# Patient Record
Sex: Female | Born: 1935 | Race: White | Hispanic: No | Smoking: Never smoker
Health system: Southern US, Community
[De-identification: ages and names within clinical notes are randomized; demographics above are authoritative.]

## PROBLEM LIST (undated history)

## (undated) DIAGNOSIS — H409 Unspecified glaucoma: Secondary | ICD-10-CM

## (undated) HISTORY — PX: ABDOMINAL HYSTERECTOMY: SHX81

## (undated) HISTORY — PX: BACK SURGERY: SHX140

---

## 1999-06-02 ENCOUNTER — Other Ambulatory Visit: Admission: RE | Admit: 1999-06-02 | Discharge: 1999-06-02 | Payer: Self-pay | Admitting: Internal Medicine

## 1999-08-02 ENCOUNTER — Encounter: Admission: RE | Admit: 1999-08-02 | Discharge: 1999-08-02 | Payer: Self-pay | Admitting: Internal Medicine

## 1999-08-02 ENCOUNTER — Encounter: Payer: Self-pay | Admitting: Internal Medicine

## 1999-08-25 ENCOUNTER — Ambulatory Visit (HOSPITAL_COMMUNITY): Admission: RE | Admit: 1999-08-25 | Discharge: 1999-08-25 | Payer: Self-pay | Admitting: Gastroenterology

## 2000-06-09 ENCOUNTER — Other Ambulatory Visit: Admission: RE | Admit: 2000-06-09 | Discharge: 2000-06-09 | Payer: Self-pay | Admitting: Internal Medicine

## 2000-08-03 ENCOUNTER — Encounter: Payer: Self-pay | Admitting: Internal Medicine

## 2000-08-03 ENCOUNTER — Encounter: Admission: RE | Admit: 2000-08-03 | Discharge: 2000-08-03 | Payer: Self-pay | Admitting: Internal Medicine

## 2001-05-07 ENCOUNTER — Encounter: Payer: Self-pay | Admitting: Internal Medicine

## 2001-05-07 ENCOUNTER — Encounter: Admission: RE | Admit: 2001-05-07 | Discharge: 2001-05-07 | Payer: Self-pay | Admitting: Internal Medicine

## 2001-05-10 ENCOUNTER — Encounter: Payer: Self-pay | Admitting: Internal Medicine

## 2001-05-10 ENCOUNTER — Encounter: Admission: RE | Admit: 2001-05-10 | Discharge: 2001-05-10 | Payer: Self-pay | Admitting: Internal Medicine

## 2001-08-06 ENCOUNTER — Encounter: Payer: Self-pay | Admitting: Internal Medicine

## 2001-08-06 ENCOUNTER — Encounter: Admission: RE | Admit: 2001-08-06 | Discharge: 2001-08-06 | Payer: Self-pay | Admitting: Internal Medicine

## 2002-08-08 ENCOUNTER — Encounter: Admission: RE | Admit: 2002-08-08 | Discharge: 2002-08-08 | Payer: Self-pay | Admitting: Internal Medicine

## 2002-08-08 ENCOUNTER — Encounter: Payer: Self-pay | Admitting: Internal Medicine

## 2002-08-20 ENCOUNTER — Other Ambulatory Visit: Admission: RE | Admit: 2002-08-20 | Discharge: 2002-08-20 | Payer: Self-pay | Admitting: Internal Medicine

## 2003-06-23 ENCOUNTER — Encounter (INDEPENDENT_AMBULATORY_CARE_PROVIDER_SITE_OTHER): Payer: Self-pay | Admitting: Specialist

## 2003-06-23 ENCOUNTER — Ambulatory Visit (HOSPITAL_COMMUNITY): Admission: RE | Admit: 2003-06-23 | Discharge: 2003-06-23 | Payer: Self-pay | Admitting: *Deleted

## 2003-08-18 ENCOUNTER — Encounter: Admission: RE | Admit: 2003-08-18 | Discharge: 2003-08-18 | Payer: Self-pay | Admitting: Internal Medicine

## 2005-10-25 ENCOUNTER — Other Ambulatory Visit: Admission: RE | Admit: 2005-10-25 | Discharge: 2005-10-25 | Payer: Self-pay | Admitting: Internal Medicine

## 2006-09-19 HISTORY — PX: RECTAL PROLAPSE REPAIR: SHX759

## 2006-11-23 ENCOUNTER — Encounter: Admission: RE | Admit: 2006-11-23 | Discharge: 2006-11-23 | Payer: Self-pay | Admitting: Internal Medicine

## 2007-11-27 ENCOUNTER — Encounter: Admission: RE | Admit: 2007-11-27 | Discharge: 2007-11-27 | Payer: Self-pay | Admitting: Internal Medicine

## 2008-12-12 ENCOUNTER — Encounter: Admission: RE | Admit: 2008-12-12 | Discharge: 2008-12-12 | Payer: Self-pay | Admitting: Internal Medicine

## 2009-05-06 ENCOUNTER — Other Ambulatory Visit: Admission: RE | Admit: 2009-05-06 | Discharge: 2009-05-06 | Payer: Self-pay | Admitting: Obstetrics and Gynecology

## 2009-11-16 ENCOUNTER — Inpatient Hospital Stay (HOSPITAL_COMMUNITY): Admission: RE | Admit: 2009-11-16 | Discharge: 2009-11-18 | Payer: Self-pay | Admitting: Obstetrics and Gynecology

## 2009-11-16 ENCOUNTER — Encounter (INDEPENDENT_AMBULATORY_CARE_PROVIDER_SITE_OTHER): Payer: Self-pay | Admitting: Obstetrics and Gynecology

## 2009-11-25 ENCOUNTER — Inpatient Hospital Stay (HOSPITAL_COMMUNITY): Admission: EM | Admit: 2009-11-25 | Discharge: 2009-11-27 | Payer: Self-pay | Admitting: Emergency Medicine

## 2009-11-26 ENCOUNTER — Encounter (INDEPENDENT_AMBULATORY_CARE_PROVIDER_SITE_OTHER): Payer: Self-pay | Admitting: Internal Medicine

## 2009-11-26 ENCOUNTER — Ambulatory Visit: Payer: Self-pay | Admitting: Vascular Surgery

## 2010-05-10 ENCOUNTER — Encounter: Admission: RE | Admit: 2010-05-10 | Discharge: 2010-05-10 | Payer: Self-pay | Admitting: Internal Medicine

## 2010-12-08 LAB — URINE MICROSCOPIC-ADD ON

## 2010-12-08 LAB — TYPE AND SCREEN
ABO/RH(D): A POS
ABO/RH(D): A POS

## 2010-12-08 LAB — CBC
HCT: 24.3 % — ABNORMAL LOW (ref 36.0–46.0)
Hemoglobin: 8.3 g/dL — ABNORMAL LOW (ref 12.0–15.0)
MCHC: 33.3 g/dL (ref 30.0–36.0)
MCHC: 34.1 g/dL (ref 30.0–36.0)
MCV: 89 fL (ref 78.0–100.0)
MCV: 89.3 fL (ref 78.0–100.0)
Platelets: 120 10*3/uL — ABNORMAL LOW (ref 150–400)
Platelets: 170 10*3/uL (ref 150–400)
RBC: 4.05 MIL/uL (ref 3.87–5.11)
RDW: 13.1 % (ref 11.5–15.5)
RDW: 13.3 % (ref 11.5–15.5)

## 2010-12-08 LAB — URINALYSIS, ROUTINE W REFLEX MICROSCOPIC
Glucose, UA: NEGATIVE mg/dL
Hgb urine dipstick: NEGATIVE
Nitrite: NEGATIVE

## 2010-12-08 LAB — BASIC METABOLIC PANEL
CO2: 30 mEq/L (ref 19–32)
GFR calc non Af Amer: >60 mL/min (ref >60)
Glucose, Bld: 83 mg/dL (ref 70–99)
Sodium: 138 mEq/L (ref 135–145)

## 2010-12-12 LAB — HEMOGLOBIN: Hemoglobin: 7.9 g/dL — ABNORMAL LOW (ref 12.0–15.0)

## 2010-12-12 LAB — CBC
HCT: 22.1 % — ABNORMAL LOW (ref 36.0–46.0)
MCV: 90.4 fL (ref 78.0–100.0)
Platelets: 111 10*3/uL — ABNORMAL LOW (ref 150–400)
WBC: 9 10*3/uL (ref 4.0–10.5)

## 2010-12-13 LAB — CBC
HCT: 26.7 % — ABNORMAL LOW (ref 36.0–46.0)
Hemoglobin: 8.7 g/dL — ABNORMAL LOW (ref 12.0–15.0)
Hemoglobin: 9 g/dL — ABNORMAL LOW (ref 12.0–15.0)
MCV: 89.7 fL (ref 78.0–100.0)
Platelets: 224 10*3/uL (ref 150–400)
RBC: 2.87 MIL/uL — ABNORMAL LOW (ref 3.87–5.11)
RBC: 2.99 MIL/uL — ABNORMAL LOW (ref 3.87–5.11)
WBC: 6.2 10*3/uL (ref 4.0–10.5)
WBC: 8.5 10*3/uL (ref 4.0–10.5)

## 2010-12-13 LAB — POCT CARDIAC MARKERS: Myoglobin, poc: 77.1 ng/mL (ref 12–200)

## 2010-12-13 LAB — COMPREHENSIVE METABOLIC PANEL
ALT: 11 U/L (ref 0–35)
AST: 15 U/L (ref 0–37)
CO2: 27 mEq/L (ref 19–32)
Chloride: 105 mEq/L (ref 96–112)
GFR calc Af Amer: >60 mL/min (ref >60)
GFR calc non Af Amer: >60 mL/min (ref >60)
Glucose, Bld: 84 mg/dL (ref 70–99)
Sodium: 140 mEq/L (ref 135–145)
Total Bilirubin: 0.4 mg/dL (ref 0.3–1.2)

## 2010-12-13 LAB — FERRITIN: Ferritin: 39 ng/mL (ref 10–291)

## 2010-12-13 LAB — DIFFERENTIAL
Basophils Absolute: 0 10*3/uL (ref 0.0–0.1)
Eosinophils Relative: 2 % (ref 0–5)
Lymphs Abs: 1 10*3/uL (ref 0.7–4.0)
Monocytes Absolute: 0.7 10*3/uL (ref 0.1–1.0)
Monocytes Relative: 8 % (ref 3–12)

## 2010-12-13 LAB — CK TOTAL AND CKMB (NOT AT ARMC): CK, MB: 1 ng/mL (ref 0.3–4.0)

## 2010-12-13 LAB — D-DIMER, QUANTITATIVE: D-Dimer, Quant: 3.56 ug/mL-FEU — ABNORMAL HIGH (ref 0.00–0.48)

## 2010-12-13 LAB — TROPONIN I: Troponin I: <0.01 ng/mL (ref 0.00–0.06)

## 2010-12-13 LAB — POCT I-STAT, CHEM 8
BUN: 15 mg/dL (ref 6–23)
Chloride: 104 mEq/L (ref 96–112)
Glucose, Bld: 88 mg/dL (ref 70–99)

## 2010-12-13 LAB — IRON AND TIBC
Saturation Ratios: 9 % — ABNORMAL LOW (ref 20–55)
TIBC: 340 ug/dL (ref 250–470)

## 2010-12-13 LAB — CARDIAC PANEL(CRET KIN+CKTOT+MB+TROPI)
Relative Index: INVALID (ref 0.0–2.5)
Total CK: 49 U/L (ref 7–177)

## 2010-12-13 LAB — FOLATE: Folate: >20 ng/mL

## 2011-02-03 ENCOUNTER — Other Ambulatory Visit: Payer: Self-pay | Admitting: Gastroenterology

## 2011-02-03 DIAGNOSIS — Z1211 Encounter for screening for malignant neoplasm of colon: Secondary | ICD-10-CM

## 2011-02-04 NOTE — Op Note (Signed)
   NAME:  Sara Humphrey, Sara Humphrey                        ACCOUNT NO.:  [id number]   MEDICAL RECORD NO.:  [id number]                   PATIENT TYPE:  AMB   LOCATION:  SDC                                  FACILITY:  [facility]   PHYSICIAN:  [place] B. Earlene Plater, M.D.               DATE OF BIRTH:  1931-04-26   DATE OF PROCEDURE:  06/23/2003  DATE OF DISCHARGE:                                 OPERATIVE REPORT   PREOPERATIVE DIAGNOSES:  1. Postmenopausal bleeding.  2. Endometrial mass on saline infusion ultrasound.   POSTOPERATIVE DIAGNOSES:  1. Postmenopausal bleeding.  2. Endometrial mass on saline infusion ultrasound.   PROCEDURE:  Hysteroscopy and resection of anterior submucosal fibroid.   ANESTHESIA:  General and 20 mL 2% Nesacaine paracervical block.   FINDINGS:  Thin endometrium.  Normal-appearing tubal ostia.  Anterior  submucosal fibroid approximately 2-3 cm in diameter.   ESTIMATED BLOOD LOSS:  Less than 50 mL.   FLUID DEFICIT:  40 mL of sorbitol.   COMPLICATIONS:  None.   SPECIMENS:  Portions of submucosal fibroid.   INDICATIONS:  Patient with a history of postmenopausal bleeding.  Pelvic  ultrasound and subsequent saline infusion ultrasound were suggestive of an  endometrial mass.   DESCRIPTION OF PROCEDURE:  The patient was taken to the operating room and  general anesthesia obtained.  She was placed in the [place] stirrups and  prepped and draped in the standard fashion.  Bladder emptied with a red  rubber catheter.  Exam under anesthesia showed a normal size anterior  uterus, no adnexal masses palpable.   Speculum inserted, paracervical block placed.  A single-tooth tenaculum  placed on the anterior lip of the cervix.  The cervix dilated to a #21 and  the diagnostic hysteroscope inserted after being flushed with sorbitol.  An  apparent submucosal fibroid noted in the anterior lower uterine segment.   The cervix was then dilated up to a #31 and the resectoscope inserted after  being flushed with sorbitol.  The double loop resection tool was used to  remove the fibroid flush with the endometrial cavity.  Hemostasis attained.  No other abnormalities noted; therefore, the procedure was terminated.   Instruments were removed and the cervix was hemostatic.  The patient was  taken to the recovery room awake, alert, in stable condition.                                               Gerri Spore B. Earlene Plater, M.D.    WBD/MEDQ  D:  06/23/2003  T:  06/23/2003  Job:  [id number]

## 2011-02-04 NOTE — H&P (Signed)
   NAME:  Sara Humphrey, Sara Humphrey                        ACCOUNT NO.:  [id number]   MEDICAL RECORD NO.:  [id number]                   PATIENT TYPE:  AMB   LOCATION:  SDC                                  [facility]ILITY:  [facility]   PHYSICIAN:   B. Earlene Plater, M.D.               DATE OF BIRTH:  May 01, 1931   DATE OF ADMISSION:  DATE OF DISCHARGE:                                HISTORY & PHYSICAL   PREOPERATIVE DIAGNOSES:  1. Postmenopausal bleeding.  2. Suspected endometrial polyp.   HISTORY OF PRESENT ILLNESS:  A 77 year old white female, gravida 4, para 4,  initially seen at the request of Dr. Earl Gala for evaluation of  postmenopausal bleeding.  This has been episodic and mostly spotting.  No  aggravating or alleviating factors.  No hormone therapy in the last 10  years.  Pap smear normal in November of 2003.  Ultrasound in the office  showed a focal endometrial thickening and subsequent saline infusion  ultrasound suggestive of endometrial polyp.   PAST MEDICAL HISTORY:  Osteoporosis.   PAST SURGICAL HISTORY:  1. Benign breast lump removal.  2. Back surgery.   MEDICATIONS:  Fosamax, aspirin, and vitamins.   ALLERGIES:  None.   SOCIAL HISTORY:  No alcohol, tobacco, or drugs.   FAMILY HISTORY:  Noncontributory.   REVIEW OF SYSTEMS:  Otherwise negative.   PHYSICAL EXAMINATION:  VITAL SIGNS:  Blood pressure 130/86, pulse 72.  WEIGHT:  130 pounds.  GENERAL APPEARANCE:  Alert and oriented.  In no acute distress.  SKIN:  Warm and dry with no lesions.  HEART:  Regular rate and rhythm.  LUNGS:  Clear to auscultation.  ABDOMEN:  The liver and spleen are normal.  No hernia noted.  PELVIC:  Normal external genitalia.  The vagina and cervix are normal.  The  uterus is normal size, anteverted, and nontender.  No adnexal masses  palpable.  Pelvic ultrasound showed endometrial thickening.  Subsequent  sonohysterogram showed a 1.5 cm, hyperechoic mass in the endometrial cavity.   ASSESSMENT:   Postmenopausal bleeding.  Ultrasound findings suggestive of  endometrial polyp.   PLAN:  1. Hysteroscopy.  2. D&C.  3. Polyp removal.   Operative risks discussed, including infection, bleeding, uterine  perforation, damage to surrounding organs, and fluid overload.  All  questions answered.  The patient wished to proceed.                                               Gerri Spore B. Earlene Plater, M.D.    WBD/MEDQ  D:  06/16/2003  T:  06/16/2003  Job:  [id number]   cc:   Theressa Millard, M.D.  [address] [place]  [place] [address]  Fax: [phone]

## 2011-02-11 ENCOUNTER — Ambulatory Visit
Admission: RE | Admit: 2011-02-11 | Discharge: 2011-02-11 | Disposition: A | Discharge status: Home / Self Care | Payer: Medicare Other | Source: Ambulatory Visit | Attending: Gastroenterology | Admitting: Gastroenterology

## 2011-02-11 DIAGNOSIS — Z1211 Encounter for screening for malignant neoplasm of colon: Secondary | ICD-10-CM

## 2011-10-25 ENCOUNTER — Other Ambulatory Visit: Payer: Self-pay | Admitting: Internal Medicine

## 2011-10-25 DIAGNOSIS — Z1231 Encounter for screening mammogram for malignant neoplasm of breast: Secondary | ICD-10-CM

## 2011-11-23 ENCOUNTER — Ambulatory Visit
Admission: RE | Admit: 2011-11-23 | Discharge: 2011-11-23 | Disposition: A | Discharge status: Home / Self Care | Payer: Medicare Other | Source: Ambulatory Visit | Attending: Internal Medicine | Admitting: Internal Medicine

## 2011-11-23 DIAGNOSIS — Z1231 Encounter for screening mammogram for malignant neoplasm of breast: Secondary | ICD-10-CM

## 2013-05-21 ENCOUNTER — Other Ambulatory Visit: Payer: Self-pay

## 2013-05-21 DIAGNOSIS — Z1231 Encounter for screening mammogram for malignant neoplasm of breast: Secondary | ICD-10-CM

## 2013-06-11 ENCOUNTER — Ambulatory Visit
Admission: RE | Admit: 2013-06-11 | Discharge: 2013-06-11 | Disposition: A | Discharge status: Home / Self Care | Payer: Medicare PPO | Source: Ambulatory Visit

## 2013-06-11 DIAGNOSIS — Z1231 Encounter for screening mammogram for malignant neoplasm of breast: Secondary | ICD-10-CM

## 2013-07-09 ENCOUNTER — Ambulatory Visit
Admission: RE | Admit: 2013-07-09 | Discharge: 2013-07-09 | Disposition: A | Discharge status: Home / Self Care | Payer: Medicare PPO | Source: Ambulatory Visit | Attending: Internal Medicine | Admitting: Internal Medicine

## 2013-07-09 ENCOUNTER — Other Ambulatory Visit: Payer: Self-pay | Admitting: Internal Medicine

## 2013-07-09 DIAGNOSIS — R52 Pain, unspecified: Secondary | ICD-10-CM

## 2013-07-11 ENCOUNTER — Other Ambulatory Visit: Payer: Self-pay | Admitting: Internal Medicine

## 2013-07-11 DIAGNOSIS — R109 Unspecified abdominal pain: Secondary | ICD-10-CM

## 2013-07-16 ENCOUNTER — Ambulatory Visit
Admission: RE | Admit: 2013-07-16 | Discharge: 2013-07-16 | Disposition: A | Discharge status: Home / Self Care | Payer: Medicare PPO | Source: Ambulatory Visit | Attending: Internal Medicine | Admitting: Internal Medicine

## 2013-07-16 DIAGNOSIS — R109 Unspecified abdominal pain: Secondary | ICD-10-CM

## 2013-07-16 MED ORDER — IOHEXOL 300 MG/ML  SOLN
100.0000 mL | Freq: Once | INTRAMUSCULAR | Status: AC | PRN
  Administered 2013-07-16: 100 mL via INTRAVENOUS

## 2014-07-31 ENCOUNTER — Other Ambulatory Visit: Payer: Self-pay

## 2014-07-31 DIAGNOSIS — Z1231 Encounter for screening mammogram for malignant neoplasm of breast: Secondary | ICD-10-CM

## 2014-08-25 ENCOUNTER — Ambulatory Visit
Admission: RE | Admit: 2014-08-25 | Discharge: 2014-08-25 | Disposition: A | Discharge status: Home / Self Care | Payer: Medicare PPO | Source: Ambulatory Visit

## 2014-08-25 DIAGNOSIS — Z1231 Encounter for screening mammogram for malignant neoplasm of breast: Secondary | ICD-10-CM

## 2014-11-06 DIAGNOSIS — H4011X1 Primary open-angle glaucoma, mild stage: Secondary | ICD-10-CM | POA: Diagnosis not present

## 2015-04-06 DIAGNOSIS — H4011X2 Primary open-angle glaucoma, moderate stage: Secondary | ICD-10-CM | POA: Diagnosis not present

## 2015-04-15 DIAGNOSIS — M25572 Pain in left ankle and joints of left foot: Secondary | ICD-10-CM | POA: Diagnosis not present

## 2015-05-08 DIAGNOSIS — M25572 Pain in left ankle and joints of left foot: Secondary | ICD-10-CM | POA: Diagnosis not present

## 2015-05-29 DIAGNOSIS — M25572 Pain in left ankle and joints of left foot: Secondary | ICD-10-CM | POA: Diagnosis not present

## 2017-08-11 ENCOUNTER — Other Ambulatory Visit: Payer: Self-pay

## 2017-08-11 ENCOUNTER — Inpatient Hospital Stay (HOSPITAL_COMMUNITY)
Admission: EM | Admit: 2017-08-11 | Discharge: 2017-08-15 | DRG: 392 | Disposition: A | Discharge status: Home / Self Care | Payer: Medicare Other | Attending: Internal Medicine | Admitting: Internal Medicine

## 2017-08-11 ENCOUNTER — Emergency Department (HOSPITAL_COMMUNITY): Payer: Medicare Other

## 2017-08-11 ENCOUNTER — Encounter (HOSPITAL_COMMUNITY): Payer: Self-pay | Admitting: Emergency Medicine

## 2017-08-11 DIAGNOSIS — Z90710 Acquired absence of both cervix and uterus: Secondary | ICD-10-CM | POA: Diagnosis not present

## 2017-08-11 DIAGNOSIS — H409 Unspecified glaucoma: Secondary | ICD-10-CM | POA: Diagnosis present

## 2017-08-11 DIAGNOSIS — D696 Thrombocytopenia, unspecified: Secondary | ICD-10-CM | POA: Diagnosis present

## 2017-08-11 DIAGNOSIS — K5792 Diverticulitis of intestine, part unspecified, without perforation or abscess without bleeding: Secondary | ICD-10-CM | POA: Diagnosis present

## 2017-08-11 DIAGNOSIS — E162 Hypoglycemia, unspecified: Secondary | ICD-10-CM | POA: Diagnosis present

## 2017-08-11 DIAGNOSIS — H4010X0 Unspecified open-angle glaucoma, stage unspecified: Secondary | ICD-10-CM | POA: Diagnosis not present

## 2017-08-11 DIAGNOSIS — J90 Pleural effusion, not elsewhere classified: Secondary | ICD-10-CM | POA: Diagnosis present

## 2017-08-11 DIAGNOSIS — N179 Acute kidney failure, unspecified: Secondary | ICD-10-CM | POA: Diagnosis present

## 2017-08-11 DIAGNOSIS — E860 Dehydration: Secondary | ICD-10-CM | POA: Diagnosis present

## 2017-08-11 DIAGNOSIS — Z7982 Long term (current) use of aspirin: Secondary | ICD-10-CM

## 2017-08-11 DIAGNOSIS — Z981 Arthrodesis status: Secondary | ICD-10-CM | POA: Diagnosis not present

## 2017-08-11 DIAGNOSIS — K5700 Diverticulitis of small intestine with perforation and abscess without bleeding: Secondary | ICD-10-CM | POA: Diagnosis present

## 2017-08-11 DIAGNOSIS — Z79899 Other long term (current) drug therapy: Secondary | ICD-10-CM

## 2017-08-11 HISTORY — DX: Unspecified glaucoma: H40.9

## 2017-08-11 LAB — URINALYSIS, ROUTINE W REFLEX MICROSCOPIC
BILIRUBIN URINE: NEGATIVE
GLUCOSE, UA: NEGATIVE mg/dL
Ketones, ur: 5 mg/dL — AB
NITRITE: NEGATIVE
Protein, ur: NEGATIVE mg/dL
SPECIFIC GRAVITY, URINE: 1.01 (ref 1.005–1.030)
pH: 5 (ref 5.0–8.0)

## 2017-08-11 LAB — CBC
HEMATOCRIT: 36.4 % (ref 36.0–46.0)
HEMOGLOBIN: 12 g/dL (ref 12.0–15.0)
MCH: 30.3 pg (ref 26.0–34.0)
MCHC: 33 g/dL (ref 30.0–36.0)
MCV: 91.9 fL (ref 78.0–100.0)
Platelets: 132 10*3/uL — ABNORMAL LOW (ref 150–400)
RBC: 3.96 MIL/uL (ref 3.87–5.11)
RDW: 13.7 % (ref 11.5–15.5)
WBC: 12.1 10*3/uL — ABNORMAL HIGH (ref 4.0–10.5)

## 2017-08-11 LAB — COMPREHENSIVE METABOLIC PANEL
ALBUMIN: 3.7 g/dL (ref 3.5–5.0)
ALT: 14 U/L (ref 14–54)
ANION GAP: 8 (ref 5–15)
AST: 20 U/L (ref 15–41)
Alkaline Phosphatase: 60 U/L (ref 38–126)
BILIRUBIN TOTAL: 1.4 mg/dL — AB (ref 0.3–1.2)
BUN: 28 mg/dL — AB (ref 6–20)
CHLORIDE: 99 mmol/L — AB (ref 101–111)
CO2: 27 mmol/L (ref 22–32)
Calcium: 8.8 mg/dL — ABNORMAL LOW (ref 8.9–10.3)
Creatinine, Ser: 1.15 mg/dL — ABNORMAL HIGH (ref 0.44–1.00)
GFR calc Af Amer: 49 mL/min — ABNORMAL LOW (ref >60)
GFR calc non Af Amer: 42 mL/min — ABNORMAL LOW (ref >60)
GLUCOSE: 95 mg/dL (ref 65–99)
POTASSIUM: 4.1 mmol/L (ref 3.5–5.1)
SODIUM: 134 mmol/L — AB (ref 135–145)
TOTAL PROTEIN: 7.8 g/dL (ref 6.5–8.1)

## 2017-08-11 LAB — LIPASE, BLOOD: Lipase: 23 U/L (ref 11–51)

## 2017-08-11 MED ORDER — SODIUM CHLORIDE 0.9 % IV BOLUS (SEPSIS)
500.0000 mL | Freq: Once | INTRAVENOUS | Status: AC
  Administered 2017-08-11: 500 mL via INTRAVENOUS

## 2017-08-11 MED ORDER — LATANOPROST 0.005 % OP SOLN
1.0000 [drp] | Freq: Every day | OPHTHALMIC | Status: DC
  Administered 2017-08-11 – 2017-08-14 (×4): 1 [drp] via OPHTHALMIC
  Filled 2017-08-11: qty 2.5

## 2017-08-11 MED ORDER — SODIUM CHLORIDE 0.9 % IV SOLN
INTRAVENOUS | Status: DC
  Administered 2017-08-11 – 2017-08-12 (×2): via INTRAVENOUS

## 2017-08-11 MED ORDER — PIPERACILLIN-TAZOBACTAM 3.375 G IVPB
3.3750 g | Freq: Three times a day (TID) | INTRAVENOUS | Status: DC
  Administered 2017-08-12 – 2017-08-15 (×10): 3.375 g via INTRAVENOUS
  Filled 2017-08-11 (×12): qty 50

## 2017-08-11 MED ORDER — PIPERACILLIN-TAZOBACTAM 3.375 G IVPB 30 MIN
3.3750 g | Freq: Once | INTRAVENOUS | Status: AC
  Administered 2017-08-11: 3.375 g via INTRAVENOUS
  Filled 2017-08-11: qty 50

## 2017-08-11 MED ORDER — IOPAMIDOL (ISOVUE-300) INJECTION 61%
75.0000 mL | Freq: Once | INTRAVENOUS | Status: AC | PRN
  Administered 2017-08-11: 75 mL via INTRAVENOUS

## 2017-08-11 MED ORDER — IOPAMIDOL (ISOVUE-300) INJECTION 61%: 100.0000 mL | Freq: Once | INTRAVENOUS | Status: DC | PRN

## 2017-08-11 MED ORDER — ENOXAPARIN SODIUM 30 MG/0.3ML ~~LOC~~ SOLN
30.0000 mg | SUBCUTANEOUS | Status: DC
  Administered 2017-08-11: 30 mg via SUBCUTANEOUS
  Filled 2017-08-11: qty 0.3

## 2017-08-11 MED ORDER — IOPAMIDOL (ISOVUE-300) INJECTION 61%
INTRAVENOUS | Status: AC
  Filled 2017-08-11: qty 75

## 2017-08-11 NOTE — ED Triage Notes (Signed)
[facility],  reports abd pressure and tenderness x3 days. Pt unable to eat or drink x3 days. Pt reports normal BM, last one this am. [facility] tests confirms elevated WBC of 14.4 and urine + for leuk and moderate blood. Pt denies dysuria or frequency. Pt is A&Ox 4 and in NAD.

## 2017-08-11 NOTE — H&P (Signed)
History and Physical    Sara PenningBetty J Jividen NWG:[id number]:[id number] DOB: October 28, 1930 DOA: 08/11/2017  PCP: Lorenda IshiharaVaradarajan, Rupashree, MD 3 Patient coming from: home   Chief Complaint: abd pain  HPI: Sara Humphrey is a 83 y.o. female with medical history significant of glaucoma presents with three days steadily worsening abdominal pain that is generalized but worse in the left lower quadrant. Also associated anorexia. No fever, no nausea/vomiting. Pain is not severe but is worse when she presses on her abdomen. Is passing flatus and stool, no diarrhea, no blood in stool. No dysuria or urinary frequency. No palpitations or chest pain. Has not happened before  ED Course: fluids, labs, imaging, gen surgery consult, zosyn  Review of Systems: As per HPI otherwise 10 point review of systems negative.    Past Medical History:  Diagnosis Date  . Glaucoma     Past Surgical History:  Procedure Laterality Date  . ABDOMINAL HYSTERECTOMY    . BACK SURGERY     fusion  . RECTAL PROLAPSE REPAIR N/A 2008   rectal prolapsed into vagina     reports that  has never smoked. she has never used smokeless tobacco. She reports that she does not drink alcohol or use drugs.  No Known Allergies  No family history on file.   Prior to Admission medications   Medication Sig Start Date End Date Taking? Authorizing Provider  Ascorbic Acid (VITAMIN C) 100 MG tablet Take 100 mg by mouth daily.   Yes [provider]  aspirin EC 81 MG tablet Take 81 mg by mouth daily.   Yes [provider]  cholecalciferol (VITAMIN D) 1000 units tablet Take 1,000 Units by mouth daily.   Yes [provider]  LUMIGAN 0.01 % SOLN PUT 1 DROP IN BOTH EYES AT BEDTIME 07/28/17  Yes [provider]  VITAMIN E PO Take 1 tablet by mouth daily.   Yes [provider]    Physical Exam: Vitals:   08/11/17 1443 08/11/17 1450 08/11/17 1514 08/11/17 1813  BP: 138/69   (!) 123/53  Pulse: 92   72  Resp: 16    15  Temp: 97.7 F (36.5 C)     TempSrc: Oral     SpO2: 97%  100% 99%  Weight:  56.7 kg (125 lb)    Height:  5' (1.524 m)      Constitutional: NAD, calm, comfortable Vitals:   08/11/17 1443 08/11/17 1450 08/11/17 1514 08/11/17 1813  BP: 138/69   (!) 123/53  Pulse: 92   72  Resp: 16   15  Temp: 97.7 F (36.5 C)     TempSrc: Oral     SpO2: 97%  100% 99%  Weight:  56.7 kg (125 lb)    Height:  5' (1.524 m)     Eyes: PERRL, lids and conjunctivae normal ENMT: Mucous membranes are moist. Neck: normal, supple,  Respiratory: clear to auscultation bilaterally, no wheezing, no crackles. Normal respiratory effort. No accessory muscle use.  Cardiovascular: Regular rate and rhythm, no murmurs / rubs / gallops. No extremity edema. 2+ pedal pulses. No carotid bruits.  Abdomen: diffuse mild ttp worse in llq, no rebound, slight guarding. Not distended.  Musculoskeletal: no clubbing / cyanosis.  Skin: no rashes, lesions, ulcers. No induration Neurologic: moving all 4 extremities Psychiatric: Normal judgment and insight.  Normal mood.     Labs on Admission: I have personally reviewed following labs and imaging studies  CBC: Recent Labs  Lab 08/11/17 1502  WBC  12.1*  HGB 12.0  HCT 36.4  MCV 91.9  PLT 132*   Basic Metabolic Panel: Recent Labs  Lab 08/11/17 1502  NA 134*  K 4.1  CL 99*  CO2 27  GLUCOSE 95  BUN 28*  CREATININE 1.15*  CALCIUM 8.8*   GFR: Estimated Creatinine Clearance: 28.7 mL/min (A) (by C-G formula based on SCr of 1.15 mg/dL (H)). Liver Function Tests: Recent Labs  Lab 08/11/17 1502  AST 20  ALT 14  ALKPHOS 60  BILITOT 1.4*  PROT 7.8  ALBUMIN 3.7   Recent Labs  Lab 08/11/17 1502  LIPASE 23   No results for input(s): AMMONIA in the last 168 hours. Coagulation Profile: No results for input(s): INR, PROTIME in the last 168 hours. Cardiac Enzymes: No results for input(s): CKTOTAL, CKMB, CKMBINDEX, TROPONINI in the last 168 hours. BNP (last 3  results) No results for input(s): PROBNP in the last 8760 hours. HbA1C: No results for input(s): HGBA1C in the last 72 hours. CBG: No results for input(s): GLUCAP in the last 168 hours. Lipid Profile: No results for input(s): CHOL, HDL, LDLCALC, TRIG, CHOLHDL, LDLDIRECT in the last 72 hours. Thyroid Function Tests: No results for input(s): TSH, T4TOTAL, FREET4, T3FREE, THYROIDAB in the last 72 hours. Anemia Panel: No results for input(s): VITAMINB12, FOLATE, FERRITIN, TIBC, IRON, RETICCTPCT in the last 72 hours. Urine analysis:    Component Value Date/Time   COLORURINE YELLOW 08/11/2017 1601   APPEARANCEUR CLEAR 08/11/2017 1601   LABSPEC 1.010 08/11/2017 1601   PHURINE 5.0 08/11/2017 1601   GLUCOSEU NEGATIVE 08/11/2017 1601   HGBUR MODERATE (A) 08/11/2017 1601   BILIRUBINUR NEGATIVE 08/11/2017 1601   KETONESUR 5 (A) 08/11/2017 1601   PROTEINUR NEGATIVE 08/11/2017 1601   UROBILINOGEN 0.2 11/13/2009 0941   NITRITE NEGATIVE 08/11/2017 1601   LEUKOCYTESUR MODERATE (A) 08/11/2017 1601    Radiological Exams on Admission: Ct Abdomen Pelvis W Contrast  Result Date: 08/11/2017 CLINICAL DATA:  Acute generalized abdominal pain. EXAM: CT ABDOMEN AND PELVIS WITH CONTRAST TECHNIQUE: Multidetector CT imaging of the abdomen and pelvis was performed using the standard protocol following bolus administration of intravenous contrast. CONTRAST:  75mL ISOVUE-300 IOPAMIDOL (ISOVUE-300) INJECTION 61% COMPARISON:  CT scan of July 16, 2013. FINDINGS: Lower chest: No acute abnormality. Hepatobiliary: No gallstones are noted. Stable hepatic cysts are noted. Pancreas: Unremarkable. No pancreatic ductal dilatation or surrounding inflammatory changes. Spleen: Normal in size without focal abnormality. Adrenals/Urinary Tract: Adrenal glands are unremarkable. Kidneys are normal, without renal calculi, focal lesion, or hydronephrosis. Bladder is unremarkable. Stomach/Bowel: Sigmoid diverticulosis is noted without  inflammation. The stomach is unremarkable. The appendix is not definitively visualized. There is severe focal inflammation involving loops of small bowel in the left lower quadrant, most consistent jejunal diverticulitis. 2.9 x 2.3 cm fluid collection is seen in this area concerning for possible abscess or contained perforation. Vascular/Lymphatic: Aortic atherosclerosis. No enlarged abdominal or pelvic lymph nodes. Reproductive: Status post hysterectomy. No adnexal masses. Other: No abdominal wall hernia or abnormality. No abdominopelvic ascites. Musculoskeletal: No acute or significant osseous findings. IMPRESSION: Severe focal inflammation is seen involving several loops of small bowel in the left lower quadrant, concerning for jejunal diverticulitis. Adjacent fluid collection measuring 2.9 x 2.3 cm is noted concerning for possible abscess or contained perforation. Electronically Signed   By: Lupita RaiderJames  Green Jr, M.D.   On: 08/11/2017 18:15     Assessment/Plan Active Problems:   Acute diverticulitis   Glaucoma  # Acute diverticulitis - history and exam consistent,  CT showing jejunal diverticulitis with possible abscess or small perforation. Hemodynamically stable, no significant pain, no sig lab anormalities. Gen surg consulted, will see tonight or in AM. Initial plan is medical mgmt - NPO, type and screen - IV fluids NS @ 125/hr - zosyn per pharmacy  # AKI - likely 2/2 dehydration given reduced by mouth. Cr 1.15 from baseline presumed to be normal - fluids as above, am metabolic profile  # thrombocytopenia - mild. Denies hx of. Does take daily aspirin (held). This could be lab error, could be 2/2 infection - repeat cbc in AM, also checking HIV and hep c  # Glaucoma - substitute latanoprost for non-formulary home med. Sons are going to check and verify what meds she takes for glaucoma and will inform nursing  DVT prophylaxis: heparin, scds Code Status: full Family Communication: son les  Saner [phone] Disposition Plan: home Consults called: gen surg Dr. Gerrit Friends Admission status: med/surg   Silvano Bilis MD [facility] Pager [phone]  If 7PM-7AM, please contact night-coverage [email or web address] Password [facility]  08/11/2017, 8:07 PM

## 2017-08-11 NOTE — ED Notes (Signed)
ED TO INPATIENT HANDOFF REPORT  Name/Age/Gender Sara Humphrey 83 y.o. female  Code Status Code Status History    This patient does not have a recorded code status. Please follow your organizational policy for patients in this situation.      Home/SNF/Other Home  Chief Complaint Abd Pain  Level of Care/Admitting Diagnosis ED Disposition    ED Disposition Condition Comment   Admit  Hospital Area: [facility] [[id number]]  Level of Care: Med-Surg [16]  Diagnosis: Acute diverticulitis [0301314]  Admitting Physician: Eston Esters  Attending Physician: Gwynne Edinger [[id number]]  Estimated length of stay: past midnight tomorrow  Certification:: I certify this patient will need inpatient services for at least 2 midnights  PT Class (Do Not Modify): Inpatient [101]  PT Acc Code (Do Not Modify): Private [1]       Medical History Past Medical History:  Diagnosis Date  . Glaucoma     Allergies No Known Allergies  IV Location/Drains/Wounds Patient Lines/Drains/Airways Status   Active Line/Drains/Airways    Name:   Placement date:   Placement time:   Site:   Days:   Peripheral IV 08/11/17 Right Antecubital   08/11/17    1505    Antecubital   less than 1          Labs/Imaging Results for orders placed or performed during the hospital encounter of 08/11/17 (from the past 48 hour(s))  Lipase, blood     Status: None   Collection Time: 08/11/17  3:02 PM  Result Value Ref Range   Lipase 23 11 - 51 U/L  Comprehensive metabolic panel     Status: Abnormal   Collection Time: 08/11/17  3:02 PM  Result Value Ref Range   Sodium 134 (L) 135 - 145 mmol/L   Potassium 4.1 3.5 - 5.1 mmol/L   Chloride 99 (L) 101 - 111 mmol/L   CO2 27 22 - 32 mmol/L   Glucose, Bld 95 65 - 99 mg/dL   BUN 28 (H) 6 - 20 mg/dL   Creatinine, Ser 1.15 (H) 0.44 - 1.00 mg/dL   Calcium 8.8 (L) 8.9 - 10.3 mg/dL   Total Protein 7.8 6.5 - 8.1 g/dL   Albumin 3.7 3.5 - 5.0  g/dL   AST 20 15 - 41 U/L   ALT 14 14 - 54 U/L   Alkaline Phosphatase 60 38 - 126 U/L   Total Bilirubin 1.4 (H) 0.3 - 1.2 mg/dL   GFR calc non Af Amer 42 (L) >60 mL/min   GFR calc Af Amer 49 (L) >60 mL/min    Comment: (NOTE) The eGFR has been calculated using the CKD EPI equation. This calculation has not been validated in all clinical situations. eGFR's persistently <60 mL/min signify possible Chronic Kidney Disease.    Anion gap 8 5 - 15  CBC     Status: Abnormal   Collection Time: 08/11/17  3:02 PM  Result Value Ref Range   WBC 12.1 (H) 4.0 - 10.5 K/uL   RBC 3.96 3.87 - 5.11 MIL/uL   Hemoglobin 12.0 12.0 - 15.0 g/dL   HCT 36.4 36.0 - 46.0 %   MCV 91.9 78.0 - 100.0 fL   MCH 30.3 26.0 - 34.0 pg   MCHC 33.0 30.0 - 36.0 g/dL   RDW 13.7 11.5 - 15.5 %   Platelets 132 (L) 150 - 400 K/uL  Urinalysis, Routine w reflex microscopic     Status: Abnormal   Collection Time: 08/11/17  4:01 PM  Result Value Ref Range   Color, Urine YELLOW YELLOW   APPearance CLEAR CLEAR   Specific Gravity, Urine 1.010 1.005 - 1.030   pH 5.0 5.0 - 8.0   Glucose, UA NEGATIVE NEGATIVE mg/dL   Hgb urine dipstick MODERATE (A) NEGATIVE   Bilirubin Urine NEGATIVE NEGATIVE   Ketones, ur 5 (A) NEGATIVE mg/dL   Protein, ur NEGATIVE NEGATIVE mg/dL   Nitrite NEGATIVE NEGATIVE   Leukocytes, UA MODERATE (A) NEGATIVE   RBC / HPF 0-5 0 - 5 RBC/hpf   WBC, UA 6-30 0 - 5 WBC/hpf   Bacteria, UA RARE (A) NONE SEEN   Squamous Epithelial / LPF 0-5 (A) NONE SEEN   Mucus PRESENT    Ct Abdomen Pelvis W Contrast  Result Date: 08/11/2017 CLINICAL DATA:  Acute generalized abdominal pain. EXAM: CT ABDOMEN AND PELVIS WITH CONTRAST TECHNIQUE: Multidetector CT imaging of the abdomen and pelvis was performed using the standard protocol following bolus administration of intravenous contrast. CONTRAST:  49m ISOVUE-300 IOPAMIDOL (ISOVUE-300) INJECTION 61% COMPARISON:  CT scan of July 16, 2013. FINDINGS: Lower chest: No acute  abnormality. Hepatobiliary: No gallstones are noted. Stable hepatic cysts are noted. Pancreas: Unremarkable. No pancreatic ductal dilatation or surrounding inflammatory changes. Spleen: Normal in size without focal abnormality. Adrenals/Urinary Tract: Adrenal glands are unremarkable. Kidneys are normal, without renal calculi, focal lesion, or hydronephrosis. Bladder is unremarkable. Stomach/Bowel: Sigmoid diverticulosis is noted without inflammation. The stomach is unremarkable. The appendix is not definitively visualized. There is severe focal inflammation involving loops of small bowel in the left lower quadrant, most consistent jejunal diverticulitis. 2.9 x 2.3 cm fluid collection is seen in this area concerning for possible abscess or contained perforation. Vascular/Lymphatic: Aortic atherosclerosis. No enlarged abdominal or pelvic lymph nodes. Reproductive: Status post hysterectomy. No adnexal masses. Other: No abdominal wall hernia or abnormality. No abdominopelvic ascites. Musculoskeletal: No acute or significant osseous findings. IMPRESSION: Severe focal inflammation is seen involving several loops of small bowel in the left lower quadrant, concerning for jejunal diverticulitis. Adjacent fluid collection measuring 2.9 x 2.3 cm is noted concerning for possible abscess or contained perforation. Electronically Signed   By: JMarijo Conception M.D.   On: 08/11/2017 18:15    Pending Labs Unresulted Labs (From admission, onward)   Start     Ordered   08/11/17 1940  Culture, Urine  Add-on,   R     08/11/17 1939   Signed and Held  CBC  (enoxaparin (LOVENOX)    CrCl >/= 30 ml/min)  Once,   R    Comments:  Baseline for enoxaparin therapy IF NOT ALREADY DRAWN.  Notify MD if PLT < 100 K.    Signed and Held   Signed and Held  Creatinine, serum  (enoxaparin (LOVENOX)    CrCl >/= 30 ml/min)  Once,   R    Comments:  Baseline for enoxaparin therapy IF NOT ALREADY DRAWN.    Signed and Held   Signed and Held   Creatinine, serum  (enoxaparin (LOVENOX)    CrCl >/= 30 ml/min)  Weekly,   R    Comments:  while on enoxaparin therapy    Signed and Held   Signed and Held  Comprehensive metabolic panel  Tomorrow morning,   R     Signed and Held   Signed and Held  CBC WITH DIFFERENTIAL  Tomorrow morning,   R     Signed and Held   Signed and Held  HIV antibody  TArchitectural technologist  morning,   R     Signed and Held   Signed and Held  Hepatitis c antibody (reflex)  Tomorrow morning,   R     Signed and Held   Signed and Held  Protime-INR  Tomorrow morning,   R     Signed and Held   Signed and Held  Type and screen [place]  Once,   R    Comments:  [place]    Signed and Held      Vitals/Pain Today's Vitals   08/11/17 1514 08/11/17 1514 08/11/17 1813 08/11/17 2030  BP:   (!) 123/53 124/62  Pulse:   72 70  Resp:   15 15  Temp:      TempSrc:      SpO2: 100%  99% 98%  Weight:      Height:      PainSc:  5       Isolation Precautions No active isolations  Medications Medications  iopamidol (ISOVUE-300) 61 % injection (not administered)  sodium chloride 0.9 % bolus 500 mL (0 mLs Intravenous Stopped 08/11/17 1812)  iopamidol (ISOVUE-300) 61 % injection 75 mL (75 mLs Intravenous Contrast Given 08/11/17 1725)  piperacillin-tazobactam (ZOSYN) IVPB 3.375 g (0 g Intravenous Stopped 08/11/17 2030)  sodium chloride 0.9 % bolus 500 mL (500 mLs Intravenous New Bag/Given 08/11/17 2001)    Mobility walks

## 2017-08-11 NOTE — ED Provider Notes (Signed)
[facility]-EMERGENCY DEPT Provider Note   CSN: [id number] Arrival date & time: 08/11/17  1431     History   Chief Complaint Chief Complaint  Patient presents with  . Abdominal Pain    HPI Sara Humphrey is a 83 y.o. female.  Sara Humphrey is a 83 y.o. Female who presents to the ED complaining of generalized abdominal pain and loss of appetite for three days. Patient reports she has not eaten in about two days. She reports some nausea yesterday, but none today. She has had no vomiting or diarrhea.  She reports her last bowel movement was today and was normal.  She reports her previous abdominal surgical history includes a hysterectomy. She reports pain that is generalized to her abdomen with palpation of the area.  No treatments attempted prior to arrival.  She denies fevers, constipation, hematemesis, hematochezia, urinary symptoms, nausea, vomiting, sore throat, rashes, chest pain, coughing or shortness of breath.   The history is provided by the patient and medical records. No language interpreter was used.  Abdominal Pain   Pertinent negatives include fever, diarrhea, nausea, vomiting, constipation, dysuria, frequency and headaches.    Past Medical History:  Diagnosis Date  . Glaucoma     Patient Active Problem List   Diagnosis Date Noted  . Acute diverticulitis 08/11/2017  . Glaucoma 08/11/2017  . Diverticulitis of small intestine with abscess     Past Surgical History:  Procedure Laterality Date  . ABDOMINAL HYSTERECTOMY    . BACK SURGERY     fusion  . RECTAL PROLAPSE REPAIR N/A 2008   rectal prolapsed into vagina    OB History    No data available       Home Medications    Prior to Admission medications   Medication Sig Start Date End Date Taking? Authorizing Provider  Ascorbic Acid (VITAMIN C) 100 MG tablet Take 100 mg by mouth daily.   Yes [provider]  aspirin EC 81 MG tablet Take 81 mg by mouth daily.   Yes  [provider]  cholecalciferol (VITAMIN D) 1000 units tablet Take 1,000 Units by mouth daily.   Yes [provider]  LUMIGAN 0.01 % SOLN PUT 1 DROP IN BOTH EYES AT BEDTIME 07/28/17  Yes [provider]  VITAMIN E PO Take 1 tablet by mouth daily.   Yes [provider]    Family History No family history on file.  Social History Social History   Tobacco Use  . Smoking status: Never Smoker  . Smokeless tobacco: Never Used  Substance Use Topics  . Alcohol use: No    Frequency: Never  . Drug use: No     Allergies   Patient has no known allergies.   Review of Systems Review of Systems  Constitutional: Positive for appetite change. Negative for chills and fever.  HENT: Negative for congestion and sore throat.   Eyes: Negative for visual disturbance.  Respiratory: Negative for cough, shortness of breath and wheezing.   Cardiovascular: Negative for chest pain and palpitations.  Gastrointestinal: Positive for abdominal pain. Negative for abdominal distention, blood in stool, constipation, diarrhea, nausea and vomiting.  Genitourinary: Negative for difficulty urinating, dysuria, frequency and urgency.  Musculoskeletal: Negative for back pain and neck pain.  Skin: Negative for rash.  Neurological: Negative for headaches.     Physical Exam Updated Vital Signs BP 124/62 (BP Location: Left Arm)   Pulse 70   Temp 97.7 F (36.5 C) (Oral)  Resp 15   Ht 5' (1.524 m)   Wt 56.7 kg (125 lb)   SpO2 98%   BMI 24.41 kg/m   Physical Exam  Constitutional: She appears well-developed and well-nourished.  Non-toxic appearance. She does not appear ill. No distress.  HENT:  Head: Normocephalic and atraumatic.  Mouth/Throat: Oropharynx is clear and moist.  Eyes: Conjunctivae are normal. Pupils are equal, round, and reactive to light. Right eye exhibits no discharge. Left eye exhibits no discharge.  Neck: Neck supple.  Cardiovascular: Normal rate,  regular rhythm, normal heart sounds and intact distal pulses. Exam reveals no gallop and no friction rub.  No murmur heard. Pulmonary/Chest: Effort normal and breath sounds normal. No respiratory distress. She has no wheezes. She has no rales.  Abdominal: Soft. Bowel sounds are normal. She exhibits no distension, no ascites and no mass. There is generalized tenderness. There is no rigidity and no CVA tenderness.  Abdomen is soft.  Bowel sounds are present.  Patient has mild generalized abdominal tenderness to palpation without area of focal tenderness.  Musculoskeletal: She exhibits no edema.  Lymphadenopathy:    She has no cervical adenopathy.  Neurological: She is alert. Coordination normal.  Skin: Skin is warm and dry. No rash noted. She is not diaphoretic. No erythema. No pallor.  Psychiatric: She has a normal mood and affect. Her behavior is normal.  Nursing note and vitals reviewed.    ED Treatments / Results  Labs (all labs ordered are listed, but only abnormal results are displayed) Labs Reviewed  COMPREHENSIVE METABOLIC PANEL - Abnormal; Notable for the following components:      Result Value   Sodium 134 (*)    Chloride 99 (*)    BUN 28 (*)    Creatinine, Ser 1.15 (*)    Calcium 8.8 (*)    Total Bilirubin 1.4 (*)    GFR calc non Af Amer 42 (*)    GFR calc Af Amer 49 (*)    All other components within normal limits  CBC - Abnormal; Notable for the following components:   WBC 12.1 (*)    Platelets 132 (*)    All other components within normal limits  URINALYSIS, ROUTINE W REFLEX MICROSCOPIC - Abnormal; Notable for the following components:   Hgb urine dipstick MODERATE (*)    Ketones, ur 5 (*)    Leukocytes, UA MODERATE (*)    Bacteria, UA RARE (*)    Squamous Epithelial / LPF 0-5 (*)    All other components within normal limits  URINE CULTURE  LIPASE, BLOOD    EKG  EKG Interpretation None       Radiology Ct Abdomen Pelvis W Contrast  Result Date:  08/11/2017 CLINICAL DATA:  Acute generalized abdominal pain. EXAM: CT ABDOMEN AND PELVIS WITH CONTRAST TECHNIQUE: Multidetector CT imaging of the abdomen and pelvis was performed using the standard protocol following bolus administration of intravenous contrast. CONTRAST:  75mL ISOVUE-300 IOPAMIDOL (ISOVUE-300) INJECTION 61% COMPARISON:  CT scan of July 16, 2013. FINDINGS: Lower chest: No acute abnormality. Hepatobiliary: No gallstones are noted. Stable hepatic cysts are noted. Pancreas: Unremarkable. No pancreatic ductal dilatation or surrounding inflammatory changes. Spleen: Normal in size without focal abnormality. Adrenals/Urinary Tract: Adrenal glands are unremarkable. Kidneys are normal, without renal calculi, focal lesion, or hydronephrosis. Bladder is unremarkable. Stomach/Bowel: Sigmoid diverticulosis is noted without inflammation. The stomach is unremarkable. The appendix is not definitively visualized. There is severe focal inflammation involving loops of small bowel in the left lower quadrant,  most consistent jejunal diverticulitis. 2.9 x 2.3 cm fluid collection is seen in this area concerning for possible abscess or contained perforation. Vascular/Lymphatic: Aortic atherosclerosis. No enlarged abdominal or pelvic lymph nodes. Reproductive: Status post hysterectomy. No adnexal masses. Other: No abdominal wall hernia or abnormality. No abdominopelvic ascites. Musculoskeletal: No acute or significant osseous findings. IMPRESSION: Severe focal inflammation is seen involving several loops of small bowel in the left lower quadrant, concerning for jejunal diverticulitis. Adjacent fluid collection measuring 2.9 x 2.3 cm is noted concerning for possible abscess or contained perforation. Electronically Signed   By: Lupita RaiderJames  Green Jr, M.D.   On: 08/11/2017 18:15    Procedures Procedures (including critical care time)  Medications Ordered in ED Medications  iopamidol (ISOVUE-300) 61 % injection (not  administered)  sodium chloride 0.9 % bolus 500 mL (0 mLs Intravenous Stopped 08/11/17 1812)  iopamidol (ISOVUE-300) 61 % injection 75 mL (75 mLs Intravenous Contrast Given 08/11/17 1725)  piperacillin-tazobactam (ZOSYN) IVPB 3.375 g (0 g Intravenous Stopped 08/11/17 2030)  sodium chloride 0.9 % bolus 500 mL (500 mLs Intravenous New Bag/Given 08/11/17 2001)     Initial Impression / Assessment and Plan / ED Course  I have reviewed the triage vital signs and the nursing notes.  Pertinent labs & imaging results that were available during my care of the patient were reviewed by me and considered in my medical decision making (see chart for details).    This is a 83 y.o. Female who presents to the ED complaining of generalized abdominal pain and loss of appetite for three days. Patient reports she has not eaten in about two days. She reports some nausea yesterday, but none today. She has had no vomiting or diarrhea.  She reports her last bowel movement was today and was normal.  She reports her previous abdominal surgical history includes a hysterectomy. She reports pain that is generalized to her abdomen with palpation of the area.  On exam the patient is afebrile and non-toxic appearing.  Her abdomen is soft and she has generalized abdominal tenderness to palpation without focal tenderness. CBC reveals mild leukocytosis with a white count of 12,000. CMP is remarkable for creatinine of 1.15.  No recent blood work to compare. CT abdomen and pelvis with contrast shows evidence concerning for jejunal diverticulitis.  There is also a fluid collection measuring 2.9 x 2.3 cm that is concerning for possible abscess or contained perforation. Will start Zosyn and consult with general surgery.  I consulted with general surgeon Dr. Gerrit FriendsGerkin who agrees with plan for medical treatment initially with Zosyn and admission. He will see her in consult.   I consulted with [facility] service who accepted the  patient for admission.  This patient was discussed with and evaluated by Dr. Lynelle DoctorKnapp who agrees with assessment and plan.    Final Clinical Impressions(s) / ED Diagnoses   Final diagnoses:  Diverticulitis of small intestine with abscess, unspecified bleeding status    ED Discharge Orders    None       Lorene DyDansie, Derricka Mertz, PA-C 08/11/17 2050    Linwood DibblesKnapp, Jon, MD 08/12/17 [phone]

## 2017-08-11 NOTE — Progress Notes (Signed)
Pharmacy Antibiotic Note  Sara Humphrey is a 83 y.o. female admitted on 08/11/2017 with acute diverticulitis.  Pharmacy has been consulted for Zosyn dosing.  Plan: Zosyn 3.375gm IV q8h (4hr extended infusions) No further dosing adjustments needed Pharmacy will sign off  Height: 5' (152.4 cm) Weight: 125 lb (56.7 kg) IBW/kg (Calculated) : 45.5  Temp (24hrs), Avg:98.1 F (36.7 C), Min:97.7 F (36.5 C), Max:98.4 F (36.9 C)  Recent Labs  Lab 08/11/17 1502  WBC 12.1*  CREATININE 1.15*    Estimated Creatinine Clearance: 28.7 mL/min (A) (by C-G formula based on SCr of 1.15 mg/dL (H)).    No Known Allergies  Antimicrobials this admission: 11/23 Zosyn >>  Dose adjustments this admission: none  Microbiology results: 11/23 UCx: sent  Thank you for allowing pharmacy to be a part of this patient's care.  Loralee PacasErin Labaron Digirolamo, PharmD, BCPS Pager: [phone] 08/11/2017 9:06 PM

## 2017-08-11 NOTE — ED Provider Notes (Signed)
Pt presents for further evaluation of lower abdominal pain.  Not able to eat or drink for the last few days.  Pt was sent in from an outpatient clinic for elevated WBC and abnormal UA to get a CT scan.  Pt has ttp in the lower abdomen, suprapubic and RLL  And LLL  Will chec, micro ua and get a CT scan.   Medical screening examination/treatment/procedure(s) were conducted as a shared visit with non-physician practitioner(s) and myself.  I personally evaluated the patient during the encounter.     Linwood DibblesKnapp, Alyzah Pelly, MD 08/12/17 [phone]

## 2017-08-12 ENCOUNTER — Encounter (HOSPITAL_COMMUNITY): Payer: Self-pay | Admitting: Surgery

## 2017-08-12 LAB — COMPREHENSIVE METABOLIC PANEL
ALBUMIN: 3 g/dL — AB (ref 3.5–5.0)
ALK PHOS: 49 U/L (ref 38–126)
ALT: 12 U/L — ABNORMAL LOW (ref 14–54)
ANION GAP: 8 (ref 5–15)
AST: 15 U/L (ref 15–41)
BUN: 20 mg/dL (ref 6–20)
CALCIUM: 8.1 mg/dL — AB (ref 8.9–10.3)
CO2: 23 mmol/L (ref 22–32)
CREATININE: 0.87 mg/dL (ref 0.44–1.00)
Chloride: 108 mmol/L (ref 101–111)
GFR calc Af Amer: >60 mL/min (ref >60)
GFR calc non Af Amer: 59 mL/min — ABNORMAL LOW (ref >60)
GLUCOSE: 73 mg/dL (ref 65–99)
Potassium: 3.6 mmol/L (ref 3.5–5.1)
Sodium: 139 mmol/L (ref 135–145)
TOTAL PROTEIN: 6.1 g/dL — AB (ref 6.5–8.1)
Total Bilirubin: 1.3 mg/dL — ABNORMAL HIGH (ref 0.3–1.2)

## 2017-08-12 LAB — CBC WITH DIFFERENTIAL/PLATELET
BASOS ABS: 0 10*3/uL (ref 0.0–0.1)
BASOS PCT: 1 %
EOS ABS: 0.1 10*3/uL (ref 0.0–0.7)
EOS PCT: 2 %
HCT: 32.2 % — ABNORMAL LOW (ref 36.0–46.0)
Hemoglobin: 10.5 g/dL — ABNORMAL LOW (ref 12.0–15.0)
Lymphocytes Relative: 17 %
Lymphs Abs: 1.1 10*3/uL (ref 0.7–4.0)
MCH: 30.3 pg (ref 26.0–34.0)
MCHC: 32.6 g/dL (ref 30.0–36.0)
MCV: 92.8 fL (ref 78.0–100.0)
MONO ABS: 0.7 10*3/uL (ref 0.1–1.0)
Monocytes Relative: 11 %
Neutro Abs: 4.3 10*3/uL (ref 1.7–7.7)
Neutrophils Relative %: 69 %
PLATELETS: 136 10*3/uL — AB (ref 150–400)
RBC: 3.47 MIL/uL — ABNORMAL LOW (ref 3.87–5.11)
RDW: 14 % (ref 11.5–15.5)
WBC: 6.3 10*3/uL (ref 4.0–10.5)

## 2017-08-12 LAB — PROTIME-INR
INR: 1.17
Prothrombin Time: 14.8 seconds (ref 11.4–15.2)

## 2017-08-12 LAB — TYPE AND SCREEN
ABO/RH(D): A POS
ANTIBODY SCREEN: NEGATIVE

## 2017-08-12 LAB — ABO/RH: ABO/RH(D): A POS

## 2017-08-12 MED ORDER — ENOXAPARIN SODIUM 40 MG/0.4ML ~~LOC~~ SOLN
40.0000 mg | SUBCUTANEOUS | Status: DC
  Administered 2017-08-12 – 2017-08-14 (×3): 40 mg via SUBCUTANEOUS
  Filled 2017-08-12 (×3): qty 0.4

## 2017-08-12 MED ORDER — VITAMIN D 1000 UNITS PO TABS
1000.0000 [IU] | ORAL_TABLET | Freq: Every day | ORAL | Status: DC
  Administered 2017-08-12 – 2017-08-15 (×4): 1000 [IU] via ORAL
  Filled 2017-08-12 (×4): qty 1

## 2017-08-12 MED ORDER — LIP MEDEX EX OINT
TOPICAL_OINTMENT | CUTANEOUS | Status: AC
  Administered 2017-08-12: 16:00:00
  Filled 2017-08-12: qty 7

## 2017-08-12 MED ORDER — LIP MEDEX EX OINT
TOPICAL_OINTMENT | CUTANEOUS | Status: AC
  Filled 2017-08-12: qty 7

## 2017-08-12 MED ORDER — ASPIRIN EC 81 MG PO TBEC
81.0000 mg | DELAYED_RELEASE_TABLET | Freq: Every day | ORAL | Status: DC
  Administered 2017-08-12 – 2017-08-15 (×4): 81 mg via ORAL
  Filled 2017-08-12 (×4): qty 1

## 2017-08-12 MED ORDER — TIMOLOL MALEATE 0.5 % OP SOLN: 1.0000 [drp] | Freq: Every day | OPHTHALMIC | Status: DC

## 2017-08-12 MED ORDER — TIMOLOL MALEATE 0.5 % OP SOLN
1.0000 [drp] | Freq: Every day | OPHTHALMIC | Status: DC
  Administered 2017-08-13 – 2017-08-15 (×3): 1 [drp] via OPHTHALMIC
  Filled 2017-08-12: qty 5

## 2017-08-12 NOTE — Progress Notes (Signed)
Patient Demographics:    Sara Humphrey, is a 83 y.o. female, DOB - 03/02/1931, UEA:[id number]  Admit date - 08/11/2017   Admitting Physician Kathrynn RunningNoah Bedford Wouk, MD  Outpatient Primary MD for the patient is Lorenda IshiharaVaradarajan, Rupashree, MD  LOS - 1   Chief Complaint  Patient presents with  . Abdominal Pain        Subjective:    Sara KindsBetty Alto today has no fevers, no emesis,  No chest pain, abdominal pain is better,  multiple family members at bedside, questions answered  Assessment  & Plan :    Principal Problem:   Diverticulitis of small intestine with abscess Active Problems:   Acute diverticulitis   Glaucoma   1)Acute diverticulitis with perforation/abscess-leukocytosis has resolved (WBC is down to 6.3 from 12.1), no fevers, continue IV Zosyn, plan to repeat CT abdomen and pelvis on 08/14/2017, surgical input appreciated  2)FEN-patient remains n.p.o., continue IV fluids  Code Status : full   Disposition Plan  : home  Consults  :  Gen surg   DVT Prophylaxis  :  Lovenox    Lab Results  Component Value Date   PLT 136 (L) 08/12/2017    Inpatient Medications  Scheduled Meds: . aspirin EC  81 mg Oral Daily  . cholecalciferol  1,000 Units Oral Daily  . enoxaparin (LOVENOX) injection  40 mg Subcutaneous Q24H  . latanoprost  1 drop Both Eyes QHS  . lip balm      . timolol  1 drop Both Eyes Daily  . timolol  1 drop Both Eyes Daily   Continuous Infusions: . sodium chloride 125 mL/hr at 08/12/17 0458  . piperacillin-tazobactam (ZOSYN)  IV 3.375 g (08/12/17 1709)   PRN Meds:.    Anti-infectives (From admission, onward)   Start     Dose/Rate Route Frequency Ordered Stop   08/12/17 0200  piperacillin-tazobactam (ZOSYN) IVPB 3.375 g     3.375 g 12.5 mL/hr over 240 Minutes Intravenous Every 8 hours 08/11/17 2108     08/11/17 1900  piperacillin-tazobactam (ZOSYN) IVPB 3.375 g     3.375  g 100 mL/hr over 30 Minutes Intravenous  Once 08/11/17 1855 08/11/17 2030        Objective:   Vitals:   08/11/17 2030 08/11/17 2105 08/12/17 0500 08/12/17 1353  BP: 124/62 116/78 131/61 133/62  Pulse: 70 78 79 71  Resp: 15 16 16 18   Temp:  98.4 F (36.9 C) 98.4 F (36.9 C) 97.9 F (36.6 C)  TempSrc:  Oral Oral Oral  SpO2: 98% 98% 96% 100%  Weight:      Height:        Wt Readings from Last 3 Encounters:  08/11/17 56.7 kg (125 lb)     Intake/Output Summary (Last 24 hours) at 08/12/2017 1904 Last data filed at 08/12/2017 1744 Gross per 24 hour  Intake 1214.58 ml  Output 300 ml  Net 914.58 ml     Physical Exam  Gen:- Awake Alert,  In no apparent distress  HEENT:- [place].AT, No sclera icterus Neck-Supple Neck,No JVD,.  Lungs-  CTAB  CV- S1, S2 normal Abd-  +ve B.Sounds, Abd Soft, generalized periumbilical area tenderness, Extremity/Skin:- No  edema,       Data Review:   Micro  Results No results found for this or any previous visit (from the past 240 hour(s)).  Radiology Reports Ct Abdomen Pelvis W Contrast  Result Date: 08/11/2017 CLINICAL DATA:  Acute generalized abdominal pain. EXAM: CT ABDOMEN AND PELVIS WITH CONTRAST TECHNIQUE: Multidetector CT imaging of the abdomen and pelvis was performed using the standard protocol following bolus administration of intravenous contrast. CONTRAST:  75mL ISOVUE-300 IOPAMIDOL (ISOVUE-300) INJECTION 61% COMPARISON:  CT scan of July 16, 2013. FINDINGS: Lower chest: No acute abnormality. Hepatobiliary: No gallstones are noted. Stable hepatic cysts are noted. Pancreas: Unremarkable. No pancreatic ductal dilatation or surrounding inflammatory changes. Spleen: Normal in size without focal abnormality. Adrenals/Urinary Tract: Adrenal glands are unremarkable. Kidneys are normal, without renal calculi, focal lesion, or hydronephrosis. Bladder is unremarkable. Stomach/Bowel: Sigmoid diverticulosis is noted without inflammation. The  stomach is unremarkable. The appendix is not definitively visualized. There is severe focal inflammation involving loops of small bowel in the left lower quadrant, most consistent jejunal diverticulitis. 2.9 x 2.3 cm fluid collection is seen in this area concerning for possible abscess or contained perforation. Vascular/Lymphatic: Aortic atherosclerosis. No enlarged abdominal or pelvic lymph nodes. Reproductive: Status post hysterectomy. No adnexal masses. Other: No abdominal wall hernia or abnormality. No abdominopelvic ascites. Musculoskeletal: No acute or significant osseous findings. IMPRESSION: Severe focal inflammation is seen involving several loops of small bowel in the left lower quadrant, concerning for jejunal diverticulitis. Adjacent fluid collection measuring 2.9 x 2.3 cm is noted concerning for possible abscess or contained perforation. Electronically Signed   By: Lupita RaiderJames  Green Jr, M.D.   On: 08/11/2017 18:15     CBC Recent Labs  Lab 08/11/17 1502 08/12/17 0617  WBC 12.1* 6.3  HGB 12.0 10.5*  HCT 36.4 32.2*  PLT 132* 136*  MCV 91.9 92.8  MCH 30.3 30.3  MCHC 33.0 32.6  RDW 13.7 14.0  LYMPHSABS  --  1.1  MONOABS  --  0.7  EOSABS  --  0.1  BASOSABS  --  0.0    Chemistries  Recent Labs  Lab 08/11/17 1502 08/12/17 0617  NA 134* 139  K 4.1 3.6  CL 99* 108  CO2 27 23  GLUCOSE 95 73  BUN 28* 20  CREATININE 1.15* 0.87  CALCIUM 8.8* 8.1*  AST 20 15  ALT 14 12*  ALKPHOS 60 49  BILITOT 1.4* 1.3*   ------------------------------------------------------------------------------------------------------------------ No results for input(s): CHOL, HDL, LDLCALC, TRIG, CHOLHDL, LDLDIRECT in the last 72 hours.  No results found for: HGBA1C ------------------------------------------------------------------------------------------------------------------ No results for input(s): TSH, T4TOTAL, T3FREE, THYROIDAB in the last 72 hours.  Invalid input(s):  FREET3 ------------------------------------------------------------------------------------------------------------------ No results for input(s): VITAMINB12, FOLATE, FERRITIN, TIBC, IRON, RETICCTPCT in the last 72 hours.  Coagulation profile Recent Labs  Lab 08/12/17 0617  INR 1.17    No results for input(s): DDIMER in the last 72 hours.  Cardiac Enzymes No results for input(s): CKMB, TROPONINI, MYOGLOBIN in the last 168 hours.  Invalid input(s): CK ------------------------------------------------------------------------------------------------------------------ No results found for: BNP   Shon Haleourage Bret Vanessen M.D on 08/12/2017 at 7:04 PM  Between 7am to 7pm - Pager - [phone]  After 7pm go to [email or web address] - password [email or web address]  [facility] -  Office  [phone]   Voice Recognition Reubin Milan/Dragon dictation system was used to create this note, attempts have been made to correct errors. Please contact the author with questions and/or clarifications.

## 2017-08-12 NOTE — Consult Note (Signed)
General Surgery [facility]  Reason for Consult: jejunal diverticulitis with contained perforation  Referring Physician: Dr. Denton Brick, [facility]  Sara Humphrey is an 83 y.o. female.  HPI: patient is a pleasant 83 yo WF admitted to the medical service from [facility] for jejunal diverticulitis with contained perforation.  Patient with hx of diverticular disease and episode of diverticulitis many years ago.  No surgical interventions except for hysterectomy.  Developed abd pain approx 4 days ago.  Normal BM yesterday.  Denies fever or chills.  WBC 12.1, now normal.  CTA positive for inflammatory changes in small intestine, probably jejunum, with contained 2.9 cm fluid collection consistent with contained perforation.  Admitted to medical service and started on IV abx's.  NPO.  Past Medical History:  Diagnosis Date  . Glaucoma     Past Surgical History:  Procedure Laterality Date  . ABDOMINAL HYSTERECTOMY    . BACK SURGERY     fusion  . RECTAL PROLAPSE REPAIR N/A 2008   rectal prolapsed into vagina    History reviewed. No pertinent family history.  Social History:  reports that  has never smoked. she has never used smokeless tobacco. She reports that she does not drink alcohol or use drugs.  Allergies: No Known Allergies  Medications: I have reviewed the patient's current medications.  Results for orders placed or performed during the hospital encounter of 08/11/17 (from the past 48 hour(s))  Lipase, blood     Status: None   Collection Time: 08/11/17  3:02 PM  Result Value Ref Range   Lipase 23 11 - 51 U/L  Comprehensive metabolic panel     Status: Abnormal   Collection Time: 08/11/17  3:02 PM  Result Value Ref Range   Sodium 134 (L) 135 - 145 mmol/L   Potassium 4.1 3.5 - 5.1 mmol/L   Chloride 99 (L) 101 - 111 mmol/L   CO2 27 22 - 32 mmol/L   Glucose, Bld 95 65 - 99 mg/dL   BUN 28 (H) 6 - 20 mg/dL   Creatinine, Ser 1.15 (H) 0.44 - 1.00 mg/dL    Calcium 8.8 (L) 8.9 - 10.3 mg/dL   Total Protein 7.8 6.5 - 8.1 g/dL   Albumin 3.7 3.5 - 5.0 g/dL   AST 20 15 - 41 U/L   ALT 14 14 - 54 U/L   Alkaline Phosphatase 60 38 - 126 U/L   Total Bilirubin 1.4 (H) 0.3 - 1.2 mg/dL   GFR calc non Af Amer 42 (L) >60 mL/min   GFR calc Af Amer 49 (L) >60 mL/min    Comment: (NOTE) The eGFR has been calculated using the CKD EPI equation. This calculation has not been validated in all clinical situations. eGFR's persistently <60 mL/min signify possible Chronic Kidney Disease.    Anion gap 8 5 - 15  CBC     Status: Abnormal   Collection Time: 08/11/17  3:02 PM  Result Value Ref Range   WBC 12.1 (H) 4.0 - 10.5 K/uL   RBC 3.96 3.87 - 5.11 MIL/uL   Hemoglobin 12.0 12.0 - 15.0 g/dL   HCT 36.4 36.0 - 46.0 %   MCV 91.9 78.0 - 100.0 fL   MCH 30.3 26.0 - 34.0 pg   MCHC 33.0 30.0 - 36.0 g/dL   RDW 13.7 11.5 - 15.5 %   Platelets 132 (L) 150 - 400 K/uL  Urinalysis, Routine w reflex microscopic     Status: Abnormal   Collection Time:  08/11/17  4:01 PM  Result Value Ref Range   Color, Urine YELLOW YELLOW   APPearance CLEAR CLEAR   Specific Gravity, Urine 1.010 1.005 - 1.030   pH 5.0 5.0 - 8.0   Glucose, UA NEGATIVE NEGATIVE mg/dL   Hgb urine dipstick MODERATE (A) NEGATIVE   Bilirubin Urine NEGATIVE NEGATIVE   Ketones, ur 5 (A) NEGATIVE mg/dL   Protein, ur NEGATIVE NEGATIVE mg/dL   Nitrite NEGATIVE NEGATIVE   Leukocytes, UA MODERATE (A) NEGATIVE   RBC / HPF 0-5 0 - 5 RBC/hpf   WBC, UA 6-30 0 - 5 WBC/hpf   Bacteria, UA RARE (A) NONE SEEN   Squamous Epithelial / LPF 0-5 (A) NONE SEEN   Mucus PRESENT   Type and screen Sharon     Status: None   Collection Time: 08/12/17  6:08 AM  Result Value Ref Range   ABO/RH(D) A POS    Antibody Screen NEG    Sample Expiration 08/15/2017   Comprehensive metabolic panel     Status: Abnormal   Collection Time: 08/12/17  6:17 AM  Result Value Ref Range   Sodium 139 135 - 145 mmol/L    Potassium 3.6 3.5 - 5.1 mmol/L   Chloride 108 101 - 111 mmol/L   CO2 23 22 - 32 mmol/L   Glucose, Bld 73 65 - 99 mg/dL   BUN 20 6 - 20 mg/dL   Creatinine, Ser 0.87 0.44 - 1.00 mg/dL   Calcium 8.1 (L) 8.9 - 10.3 mg/dL   Total Protein 6.1 (L) 6.5 - 8.1 g/dL   Albumin 3.0 (L) 3.5 - 5.0 g/dL   AST 15 15 - 41 U/L   ALT 12 (L) 14 - 54 U/L   Alkaline Phosphatase 49 38 - 126 U/L   Total Bilirubin 1.3 (H) 0.3 - 1.2 mg/dL   GFR calc non Af Amer 59 (L) >60 mL/min   GFR calc Af Amer >60 >60 mL/min    Comment: (NOTE) The eGFR has been calculated using the CKD EPI equation. This calculation has not been validated in all clinical situations. eGFR's persistently <60 mL/min signify possible Chronic Kidney Disease.    Anion gap 8 5 - 15  CBC WITH DIFFERENTIAL     Status: Abnormal   Collection Time: 08/12/17  6:17 AM  Result Value Ref Range   WBC 6.3 4.0 - 10.5 K/uL   RBC 3.47 (L) 3.87 - 5.11 MIL/uL   Hemoglobin 10.5 (L) 12.0 - 15.0 g/dL   HCT 32.2 (L) 36.0 - 46.0 %   MCV 92.8 78.0 - 100.0 fL   MCH 30.3 26.0 - 34.0 pg   MCHC 32.6 30.0 - 36.0 g/dL   RDW 14.0 11.5 - 15.5 %   Platelets 136 (L) 150 - 400 K/uL   Neutrophils Relative % 69 %   Neutro Abs 4.3 1.7 - 7.7 K/uL   Lymphocytes Relative 17 %   Lymphs Abs 1.1 0.7 - 4.0 K/uL   Monocytes Relative 11 %   Monocytes Absolute 0.7 0.1 - 1.0 K/uL   Eosinophils Relative 2 %   Eosinophils Absolute 0.1 0.0 - 0.7 K/uL   Basophils Relative 1 %   Basophils Absolute 0.0 0.0 - 0.1 K/uL  Protime-INR     Status: None   Collection Time: 08/12/17  6:17 AM  Result Value Ref Range   Prothrombin Time 14.8 11.4 - 15.2 seconds   INR 1.17     Ct Abdomen Pelvis W Contrast  Result  Date: 08/11/2017 CLINICAL DATA:  Acute generalized abdominal pain. EXAM: CT ABDOMEN AND PELVIS WITH CONTRAST TECHNIQUE: Multidetector CT imaging of the abdomen and pelvis was performed using the standard protocol following bolus administration of intravenous contrast. CONTRAST:   73m ISOVUE-300 IOPAMIDOL (ISOVUE-300) INJECTION 61% COMPARISON:  CT scan of July 16, 2013. FINDINGS: Lower chest: No acute abnormality. Hepatobiliary: No gallstones are noted. Stable hepatic cysts are noted. Pancreas: Unremarkable. No pancreatic ductal dilatation or surrounding inflammatory changes. Spleen: Normal in size without focal abnormality. Adrenals/Urinary Tract: Adrenal glands are unremarkable. Kidneys are normal, without renal calculi, focal lesion, or hydronephrosis. Bladder is unremarkable. Stomach/Bowel: Sigmoid diverticulosis is noted without inflammation. The stomach is unremarkable. The appendix is not definitively visualized. There is severe focal inflammation involving loops of small bowel in the left lower quadrant, most consistent jejunal diverticulitis. 2.9 x 2.3 cm fluid collection is seen in this area concerning for possible abscess or contained perforation. Vascular/Lymphatic: Aortic atherosclerosis. No enlarged abdominal or pelvic lymph nodes. Reproductive: Status post hysterectomy. No adnexal masses. Other: No abdominal wall hernia or abnormality. No abdominopelvic ascites. Musculoskeletal: No acute or significant osseous findings. IMPRESSION: Severe focal inflammation is seen involving several loops of small bowel in the left lower quadrant, concerning for jejunal diverticulitis. Adjacent fluid collection measuring 2.9 x 2.3 cm is noted concerning for possible abscess or contained perforation. Electronically Signed   By: JMarijo Conception M.D.   On: 08/11/2017 18:15    Review of Systems  Constitutional: Negative for chills, fever and weight loss.  HENT: Negative.   Eyes: Negative.   Respiratory: Negative.   Cardiovascular: Negative.   Gastrointestinal: Positive for abdominal pain. Negative for blood in stool, constipation and diarrhea.  Genitourinary: Negative.   Musculoskeletal: Positive for back pain.  Skin: Negative.   Neurological: Negative.  Negative for weakness.   Endo/Heme/Allergies: Negative.   Psychiatric/Behavioral: Negative.    Blood pressure 131/61, pulse 79, temperature 98.4 F (36.9 C), temperature source Oral, resp. rate 16, height 5' (1.524 m), weight 56.7 kg (125 lb), SpO2 96 %. Physical Exam  Constitutional: She is oriented to person, place, and time. She appears well-developed and well-nourished. No distress.  HENT:  Head: Normocephalic and atraumatic.  Right Ear: External ear normal.  Left Ear: External ear normal.  Mouth/Throat: No oropharyngeal exudate.  Eyes: Conjunctivae are normal. Pupils are equal, round, and reactive to light. Right eye exhibits no discharge. Left eye exhibits no discharge. No scleral icterus.  Neck: Normal range of motion. Neck supple. No tracheal deviation present. No thyromegaly present.  Cardiovascular: Normal rate, regular rhythm, normal heart sounds and intact distal pulses.  No murmur heard. Respiratory: Effort normal and breath sounds normal. No respiratory distress. She has no wheezes.  GI: Soft. Bowel sounds are normal. She exhibits mass ("fullness' left mid abdomen with tenderness (mild)). She exhibits no distension. There is tenderness (mild, diffuse). There is no rebound and no guarding.  Musculoskeletal: Normal range of motion. She exhibits no edema or deformity.  Neurological: She is alert and oriented to person, place, and time.  Skin: Skin is warm and dry. She is not diaphoretic.  Psychiatric: She has a normal mood and affect. Her behavior is normal.    Assessment/Plan: Diverticulitis of small intestine (probably jejunum) with contained perfortation  Non-operative management strategy initiated  IV abx's, NPO, IV hydration  Encourage OOB, ambulation  WBC has normalized  Plan repeat CT scan Monday or Tuesday to assess contained perforation for changes  Discussed above with patient and  explained that this is small intestine and not sigmoid colon.  Medical management may be successful, but  operative intervention is still a possibility.  Would keep NPO with ice chips only for now.  Will follow with you.  Armandina Gemma, [facility] Office: [place] 08/12/2017, 8:14 AM

## 2017-08-12 NOTE — Progress Notes (Signed)
Pt takes Istalol 0.5% eye drops in both eyes in the am and Lumigan 0.01% QHS.  Pt received Xalatan eye drops last night but does not have am eye drops ordered.  Paged Dr. Mariea ClontsEmokpae FYI.

## 2017-08-13 LAB — HEPATITIS C ANTIBODY (REFLEX)

## 2017-08-13 LAB — GLUCOSE, CAPILLARY
GLUCOSE-CAPILLARY: 47 mg/dL — AB (ref 65–99)
Glucose-Capillary: 79 mg/dL (ref 65–99)
Glucose-Capillary: 112 mg/dL — ABNORMAL HIGH (ref 65–99)
Glucose-Capillary: 103 mg/dL — ABNORMAL HIGH (ref 65–99)

## 2017-08-13 LAB — HIV ANTIBODY (ROUTINE TESTING W REFLEX): HIV Screen 4th Generation wRfx: NONREACTIVE

## 2017-08-13 LAB — HCV COMMENT:

## 2017-08-13 MED ORDER — GLUCOSE 40 % PO GEL
ORAL | Status: AC
  Administered 2017-08-13: 10:00:00 2
  Filled 2017-08-13: qty 1

## 2017-08-13 MED ORDER — GLUCOSE 40 % PO GEL: 1.0000 | Freq: Once | ORAL | Status: AC

## 2017-08-13 MED ORDER — GLUCOSE 4 G PO CHEW: 1.0000 | CHEWABLE_TABLET | Freq: Once | ORAL | Status: DC

## 2017-08-13 MED ORDER — DEXTROSE-NACL 5-0.45 % IV SOLN
INTRAVENOUS | Status: DC
  Administered 2017-08-13 – 2017-08-14 (×3): via INTRAVENOUS

## 2017-08-13 NOTE — Plan of Care (Signed)
  Pain Managment: General experience of comfort will improve 08/13/2017 0304 - Progressing by Antionette CharPeng, Vuong Musa P, RN

## 2017-08-13 NOTE — Progress Notes (Signed)
Patient Demographics:    Sara Humphrey, is a 83 y.o. female, DOB - 1931/05/09, ZOX:[id number]  Admit date - 08/11/2017   Admitting Physician Kathrynn RunningNoah Bedford Wouk, MD  Outpatient Primary MD for the patient is Lorenda IshiharaVaradarajan, Rupashree, MD  LOS - 2   Chief Complaint  Patient presents with  . Abdominal Pain        Subjective:    Sara Humphrey today has no fevers, no emesis,  No chest pain, abdominal pain is better,   Remains NPO  Assessment  & Plan :    Principal Problem:   Diverticulitis of small intestine with abscess Active Problems:   Acute diverticulitis   Glaucoma   1)Acute Diverticulitis of Small Bowel (Jejunum) with Perforation/Abscess - Leukocytosis has resolved (WBC is down to 6.3 from 12.1), no fevers, continue IV Zosyn (started 08/11/17), plan to repeat CT abdomen and pelvis on 08/14/2017, surgical input appreciated   2)Hypoglycemia-due to patient being n.p.o., blood sugar 47, repeat > 70 after glucose tab, change IV fluids to D5 half-normal saline, a every 6 hours sugar check   Code Status : full  Disposition Plan  : home  Consults  :  Gen surg   DVT Prophylaxis  :  Lovenox    Lab Results  Component Value Date   PLT 136 (L) 08/12/2017    Inpatient Medications  Scheduled Meds: . aspirin EC  81 mg Oral Daily  . cholecalciferol  1,000 Units Oral Daily  . enoxaparin (LOVENOX) injection  40 mg Subcutaneous Q24H  . glucose  1 tablet Oral Once  . latanoprost  1 drop Both Eyes QHS  . timolol  1 drop Both Eyes Daily   Continuous Infusions: . dextrose 5 % and 0.45% NaCl 100 mL/hr at 08/13/17 1028  . piperacillin-tazobactam (ZOSYN)  IV Stopped (08/13/17 1329)   PRN Meds:.    Anti-infectives (From admission, onward)   Start     Dose/Rate Route Frequency Ordered Stop   08/12/17 0200  piperacillin-tazobactam (ZOSYN) IVPB 3.375 g     3.375 g 12.5 mL/hr over 240 Minutes  Intravenous Every 8 hours 08/11/17 2108     08/11/17 1900  piperacillin-tazobactam (ZOSYN) IVPB 3.375 g     3.375 g 100 mL/hr over 30 Minutes Intravenous  Once 08/11/17 1855 08/11/17 2030        Objective:   Vitals:   08/12/17 1353 08/12/17 2145 08/13/17 0533 08/13/17 1357  BP: 133/62 (!) 123/56 (!) 110/49 124/60  Pulse: 71 73 64 (!) 58  Resp: 18 17 16 14   Temp: 97.9 F (36.6 C) 98.8 F (37.1 C) 98.1 F (36.7 C) 98.6 F (37 C)  TempSrc: Oral Oral Oral Oral  SpO2: 100% 98% 99% 98%  Weight:      Height:        Wt Readings from Last 3 Encounters:  08/11/17 56.7 kg (125 lb)     Intake/Output Summary (Last 24 hours) at 08/13/2017 1649 Last data filed at 08/13/2017 1028 Gross per 24 hour  Intake 3293.33 ml  Output -  Net 3293.33 ml   Physical Exam  Gen:- Awake Alert,  In no apparent distress  HEENT:- [place].AT, No sclera icterus Neck-Supple Neck,No JVD,.  Lungs-  CTAB  CV- S1, S2 normal Abd-  +  ve B.Sounds, Abd Soft, generalized periumbilical area tenderness, Extremity/Skin:- No  edema,    Psych-appropriate affect   Data Review:   Micro Results No results found for this or any previous visit (from the past 240 hour(s)).  Radiology Reports Ct Abdomen Pelvis W Contrast  Result Date: 08/11/2017 CLINICAL DATA:  Acute generalized abdominal pain. EXAM: CT ABDOMEN AND PELVIS WITH CONTRAST TECHNIQUE: Multidetector CT imaging of the abdomen and pelvis was performed using the standard protocol following bolus administration of intravenous contrast. CONTRAST:  75mL ISOVUE-300 IOPAMIDOL (ISOVUE-300) INJECTION 61% COMPARISON:  CT scan of July 16, 2013. FINDINGS: Lower chest: No acute abnormality. Hepatobiliary: No gallstones are noted. Stable hepatic cysts are noted. Pancreas: Unremarkable. No pancreatic ductal dilatation or surrounding inflammatory changes. Spleen: Normal in size without focal abnormality. Adrenals/Urinary Tract: Adrenal glands are unremarkable. Kidneys are  normal, without renal calculi, focal lesion, or hydronephrosis. Bladder is unremarkable. Stomach/Bowel: Sigmoid diverticulosis is noted without inflammation. The stomach is unremarkable. The appendix is not definitively visualized. There is severe focal inflammation involving loops of small bowel in the left lower quadrant, most consistent jejunal diverticulitis. 2.9 x 2.3 cm fluid collection is seen in this area concerning for possible abscess or contained perforation. Vascular/Lymphatic: Aortic atherosclerosis. No enlarged abdominal or pelvic lymph nodes. Reproductive: Status post hysterectomy. No adnexal masses. Other: No abdominal wall hernia or abnormality. No abdominopelvic ascites. Musculoskeletal: No acute or significant osseous findings. IMPRESSION: Severe focal inflammation is seen involving several loops of small bowel in the left lower quadrant, concerning for jejunal diverticulitis. Adjacent fluid collection measuring 2.9 x 2.3 cm is noted concerning for possible abscess or contained perforation. Electronically Signed   By: Lupita RaiderJames  Green Jr, M.D.   On: 08/11/2017 18:15     CBC Recent Labs  Lab 08/11/17 1502 08/12/17 0617  WBC 12.1* 6.3  HGB 12.0 10.5*  HCT 36.4 32.2*  PLT 132* 136*  MCV 91.9 92.8  MCH 30.3 30.3  MCHC 33.0 32.6  RDW 13.7 14.0  LYMPHSABS  --  1.1  MONOABS  --  0.7  EOSABS  --  0.1  BASOSABS  --  0.0    Chemistries  Recent Labs  Lab 08/11/17 1502 08/12/17 0617  NA 134* 139  K 4.1 3.6  CL 99* 108  CO2 27 23  GLUCOSE 95 73  BUN 28* 20  CREATININE 1.15* 0.87  CALCIUM 8.8* 8.1*  AST 20 15  ALT 14 12*  ALKPHOS 60 49  BILITOT 1.4* 1.3*   ------------------------------------------------------------------------------------------------------------------ No results for input(s): CHOL, HDL, LDLCALC, TRIG, CHOLHDL, LDLDIRECT in the last 72 hours.  No results found for:  HGBA1C ------------------------------------------------------------------------------------------------------------------ No results for input(s): TSH, T4TOTAL, T3FREE, THYROIDAB in the last 72 hours.  Invalid input(s): FREET3 ------------------------------------------------------------------------------------------------------------------ No results for input(s): VITAMINB12, FOLATE, FERRITIN, TIBC, IRON, RETICCTPCT in the last 72 hours.  Coagulation profile Recent Labs  Lab 08/12/17 0617  INR 1.17    No results for input(s): DDIMER in the last 72 hours.  Cardiac Enzymes No results for input(s): CKMB, TROPONINI, MYOGLOBIN in the last 168 hours.  Invalid input(s): CK ------------------------------------------------------------------------------------------------------------------ No results found for: BNP   Shon Haleourage Earlene Bjelland M.D on 08/13/2017 at 4:49 PM  Between 7am to 7pm - Pager - [phone]  After 7pm go to [email or web address] - password [email or web address]  [facility] -  Office  [phone]   Voice Recognition Reubin Milan/Dragon dictation system was used to create this note, attempts have been made to correct errors. Please contact the author with questions and/or clarifications.

## 2017-08-13 NOTE — Progress Notes (Signed)
General Surgery [facility]  Assessment & Plan: HD#3 - Diverticulitis of small intestine (probably jejunum) with contained perfortation             Non-operative management strategy initiated             IV abx's, NPO, IV hydration             Encourage OOB, ambulation             WBC has normalized             Plan repeat CT scan Monday to assess contained perforation for changes  Discussed above with patient.  Medical management may be successful, but operative intervention is still a possibility. Would keep NPO with ice chips only for now.  Plan repeat CT scan on Monday 11/25 per medicine service note. Will follow with you.        Velora Hecklerodd M. Elizabella Nolet, MD, [facility]       [facility]       Office: [phone]    Chief Complaint: Small intestine diverticulitis with contained perforation  Subjective: Patient comfortable, denies pain.  Not hungry.  BM's x 2 last 24 hours.  Objective: Vital signs in last 24 hours: Temp:  [97.9 F (36.6 C)-98.8 F (37.1 C)] 98.1 F (36.7 C) (11/25 0533) Pulse Rate:  [64-73] 64 (11/25 0533) Resp:  [16-18] 16 (11/25 0533) BP: (110-133)/(49-62) 110/49 (11/25 0533) SpO2:  [98 %-100 %] 99 % (11/25 0533) Last BM Date: 08/12/17  Intake/Output from previous day: 11/24 0701 - 11/25 0700 In: 2493.3 [I.V.:2393.3; IV Piggyback:100] Out: 300 [Urine:300] Intake/Output this shift: No intake/output data recorded.  Physical Exam: HEENT - sclerae clear, mucous membranes moist Neck - soft Chest - clear bilaterally Cor - RRR Abdomen - soft without distension; BS present; mild tenderness to palpation left mid abdomen; no mass Ext - no edema, non-tender Neuro - alert & oriented, no focal deficits  Lab Results:  Recent Labs    08/11/17 1502 08/12/17 0617  WBC 12.1* 6.3  HGB 12.0 10.5*  HCT 36.4 32.2*  PLT 132* 136*   BMET Recent Labs    08/11/17 1502 08/12/17 0617  NA 134* 139  K 4.1 3.6  CL 99* 108  CO2  27 23  GLUCOSE 95 73  BUN 28* 20  CREATININE 1.15* 0.87  CALCIUM 8.8* 8.1*   PT/INR Recent Labs    08/12/17 0617  LABPROT 14.8  INR 1.17   Comprehensive Metabolic Panel:    Component Value Date/Time   NA 139 08/12/2017 0617   NA 134 (L) 08/11/2017 1502   K 3.6 08/12/2017 0617   K 4.1 08/11/2017 1502   CL 108 08/12/2017 0617   CL 99 (L) 08/11/2017 1502   CO2 23 08/12/2017 0617   CO2 27 08/11/2017 1502   BUN 20 08/12/2017 0617   BUN 28 (H) 08/11/2017 1502   CREATININE 0.87 08/12/2017 0617   CREATININE 1.15 (H) 08/11/2017 1502   GLUCOSE 73 08/12/2017 0617   GLUCOSE 95 08/11/2017 1502   CALCIUM 8.1 (L) 08/12/2017 0617   CALCIUM 8.8 (L) 08/11/2017 1502   AST 15 08/12/2017 0617   AST 20 08/11/2017 1502   ALT 12 (L) 08/12/2017 0617   ALT 14 08/11/2017 1502   ALKPHOS 49 08/12/2017 0617   ALKPHOS 60 08/11/2017 1502   BILITOT 1.3 (H) 08/12/2017 0617   BILITOT 1.4 (H) 08/11/2017 1502   PROT 6.1 (L) 08/12/2017 0617   PROT  7.8 08/11/2017 1502   ALBUMIN 3.0 (L) 08/12/2017 0617   ALBUMIN 3.7 08/11/2017 1502    Studies/Results: Ct Abdomen Pelvis W Contrast  Result Date: 08/11/2017 CLINICAL DATA:  Acute generalized abdominal pain. EXAM: CT ABDOMEN AND PELVIS WITH CONTRAST TECHNIQUE: Multidetector CT imaging of the abdomen and pelvis was performed using the standard protocol following bolus administration of intravenous contrast. CONTRAST:  75mL ISOVUE-300 IOPAMIDOL (ISOVUE-300) INJECTION 61% COMPARISON:  CT scan of July 16, 2013. FINDINGS: Lower chest: No acute abnormality. Hepatobiliary: No gallstones are noted. Stable hepatic cysts are noted. Pancreas: Unremarkable. No pancreatic ductal dilatation or surrounding inflammatory changes. Spleen: Normal in size without focal abnormality. Adrenals/Urinary Tract: Adrenal glands are unremarkable. Kidneys are normal, without renal calculi, focal lesion, or hydronephrosis. Bladder is unremarkable. Stomach/Bowel: Sigmoid diverticulosis is  noted without inflammation. The stomach is unremarkable. The appendix is not definitively visualized. There is severe focal inflammation involving loops of small bowel in the left lower quadrant, most consistent jejunal diverticulitis. 2.9 x 2.3 cm fluid collection is seen in this area concerning for possible abscess or contained perforation. Vascular/Lymphatic: Aortic atherosclerosis. No enlarged abdominal or pelvic lymph nodes. Reproductive: Status post hysterectomy. No adnexal masses. Other: No abdominal wall hernia or abnormality. No abdominopelvic ascites. Musculoskeletal: No acute or significant osseous findings. IMPRESSION: Severe focal inflammation is seen involving several loops of small bowel in the left lower quadrant, concerning for jejunal diverticulitis. Adjacent fluid collection measuring 2.9 x 2.3 cm is noted concerning for possible abscess or contained perforation. Electronically Signed   By: Lupita RaiderJames  Green Jr, M.D.   On: 08/11/2017 18:15      Donel Osowski M 08/13/2017  Patient ID: Sara PenningBetty J Porro, female   DOB: 04-24-1931, 83 y.o.   MRN: [id number]

## 2017-08-14 ENCOUNTER — Inpatient Hospital Stay (HOSPITAL_COMMUNITY): Payer: Medicare Other

## 2017-08-14 DIAGNOSIS — K5792 Diverticulitis of intestine, part unspecified, without perforation or abscess without bleeding: Secondary | ICD-10-CM

## 2017-08-14 DIAGNOSIS — K5700 Diverticulitis of small intestine with perforation and abscess without bleeding: Principal | ICD-10-CM

## 2017-08-14 LAB — GLUCOSE, CAPILLARY
GLUCOSE-CAPILLARY: 100 mg/dL — AB (ref 65–99)
GLUCOSE-CAPILLARY: 110 mg/dL — AB (ref 65–99)
GLUCOSE-CAPILLARY: 111 mg/dL — AB (ref 65–99)
GLUCOSE-CAPILLARY: 52 mg/dL — AB (ref 65–99)
Glucose-Capillary: 91 mg/dL (ref 65–99)

## 2017-08-14 LAB — CBC
HCT: 28.9 % — ABNORMAL LOW (ref 36.0–46.0)
Hemoglobin: 9.5 g/dL — ABNORMAL LOW (ref 12.0–15.0)
MCH: 29.9 pg (ref 26.0–34.0)
MCHC: 32.9 g/dL (ref 30.0–36.0)
MCV: 90.9 fL (ref 78.0–100.0)
PLATELETS: 159 10*3/uL (ref 150–400)
RBC: 3.18 MIL/uL — AB (ref 3.87–5.11)
RDW: 13.6 % (ref 11.5–15.5)
WBC: 4.7 10*3/uL (ref 4.0–10.5)

## 2017-08-14 LAB — COMPREHENSIVE METABOLIC PANEL
ALK PHOS: 35 U/L — AB (ref 38–126)
ALT: 12 U/L — AB (ref 14–54)
ANION GAP: 4 — AB (ref 5–15)
AST: 16 U/L (ref 15–41)
Albumin: 2.6 g/dL — ABNORMAL LOW (ref 3.5–5.0)
BUN: 7 mg/dL (ref 6–20)
CALCIUM: 7.8 mg/dL — AB (ref 8.9–10.3)
CHLORIDE: 109 mmol/L (ref 101–111)
CO2: 25 mmol/L (ref 22–32)
CREATININE: 0.8 mg/dL (ref 0.44–1.00)
Glucose, Bld: 118 mg/dL — ABNORMAL HIGH (ref 65–99)
Potassium: 3 mmol/L — ABNORMAL LOW (ref 3.5–5.1)
SODIUM: 138 mmol/L (ref 135–145)
Total Bilirubin: 0.8 mg/dL (ref 0.3–1.2)
Total Protein: 5.5 g/dL — ABNORMAL LOW (ref 6.5–8.1)

## 2017-08-14 LAB — URINE CULTURE: Culture: NO GROWTH

## 2017-08-14 MED ORDER — DEXTROSE-NACL 5-0.45 % IV SOLN: INTRAVENOUS | Status: AC

## 2017-08-14 MED ORDER — IOPAMIDOL (ISOVUE-300) INJECTION 61%
100.0000 mL | Freq: Once | INTRAVENOUS | Status: AC | PRN
  Administered 2017-08-14: 80 mL via INTRAVENOUS

## 2017-08-14 MED ORDER — MAGNESIUM SULFATE IN D5W 1-5 GM/100ML-% IV SOLN
1.0000 g | Freq: Once | INTRAVENOUS | Status: AC
  Administered 2017-08-14: 1 g via INTRAVENOUS
  Filled 2017-08-14: qty 100

## 2017-08-14 MED ORDER — POTASSIUM CHLORIDE 20 MEQ/15ML (10%) PO SOLN
40.0000 meq | Freq: Four times a day (QID) | ORAL | Status: AC
  Administered 2017-08-14 (×2): 40 meq via ORAL
  Filled 2017-08-14 (×2): qty 30

## 2017-08-14 MED ORDER — IOPAMIDOL (ISOVUE-300) INJECTION 61%
INTRAVENOUS | Status: AC
  Administered 2017-08-14: 15 mL
  Filled 2017-08-14: qty 30

## 2017-08-14 MED ORDER — IOPAMIDOL (ISOVUE-300) INJECTION 61%
INTRAVENOUS | Status: AC
  Filled 2017-08-14: qty 100

## 2017-08-14 NOTE — Progress Notes (Signed)
Patient Demographics:    Sara Humphrey, is a 83 y.o. female, DOB - [birth year], ZOX:[id number]  Admit date - 08/11/2017   Admitting Physician Kathrynn RunningNoah Bedford Wouk, MD  Outpatient Primary MD for the patient is Lorenda IshiharaVaradarajan, Rupashree, MD  LOS - 3   Chief Complaint  Patient presents with  . Abdominal Pain        Subjective:   Patient in bed, appears comfortable, denies any headache, no fever, no chest pain or pressure, no shortness of breath , no abdominal pain. No focal weakness.    Assessment  & Plan :     1) Acute Diverticulitis of Small Bowel (Jejunum) with Perforation/Abscess - Leukocytosis has resolved (WBC is down to 6.3 from 12.1), no fevers, continue IV Zosyn (started 08/11/17), surgery monitoring and following, clinically she is much better and pain-free, due for repeat CT scan later today.  Currently n.p.o.  Further management per general surgery.   2) Hypoglycemia- on D5 drip, currently stable, monitor.  CBG (last 3)  Recent Labs    08/14/17 0022 08/14/17 0414 08/14/17 0742  GLUCAP 110* 100* 111*    3. Dehydration and to #1 above upon admission.  Resolved after hydration.  4.  Glaucoma, continue eyedrops.  No acute issues.    Code Status : full  Disposition Plan  : home  Consults  :  Gen surg   DVT Prophylaxis  :  Lovenox    Lab Results  Component Value Date   PLT 159 08/14/2017    Inpatient Medications  Scheduled Meds: . aspirin EC  81 mg Oral Daily  . cholecalciferol  1,000 Units Oral Daily  . enoxaparin (LOVENOX) injection  40 mg Subcutaneous Q24H  . glucose  1 tablet Oral Once  . latanoprost  1 drop Both Eyes QHS  . potassium chloride  40 mEq Oral Q6H  . timolol  1 drop Both Eyes Daily   Continuous Infusions: . dextrose 5 % and 0.45% NaCl 50 mL/hr at 08/14/17 [id number]  . piperacillin-tazobactam (ZOSYN)  IV 3.375 g (08/14/17 0954)   PRN  Meds:.    Anti-infectives (From admission, onward)   Start     Dose/Rate Route Frequency Ordered Stop   08/12/17 0200  piperacillin-tazobactam (ZOSYN) IVPB 3.375 g     3.375 g 12.5 mL/hr over 240 Minutes Intravenous Every 8 hours 08/11/17 2108 08/19/17 0159   08/11/17 1900  piperacillin-tazobactam (ZOSYN) IVPB 3.375 g     3.375 g 100 mL/hr over 30 Minutes Intravenous  Once 08/11/17 1855 08/11/17 2030        Objective:   Vitals:   08/13/17 0533 08/13/17 1357 08/13/17 2100 08/14/17 0514  BP: (!) 110/49 124/60 124/60 (!) 111/47  Pulse: 64 (!) 58 61 63  Resp: 16 14 16 16   Temp: 98.1 F (36.7 C) 98.6 F (37 C) 98.2 F (36.8 C) 98.1 F (36.7 C)  TempSrc: Oral Oral Oral Oral  SpO2: 99% 98% 100% 99%  Weight:      Height:        Wt Readings from Last 3 Encounters:  08/11/17 56.7 kg (125 lb)     Intake/Output Summary (Last 24 hours) at 08/14/2017 1228 Last data filed at 08/14/2017 0958 Gross per 24 hour  Intake 2523.33 ml  Output -  Net 2523.33 ml   Physical Exam  Awake Alert, Oriented X 3, No new F.N deficits, Normal affect [place].AT,PERRAL Supple Neck,No JVD, No cervical lymphadenopathy appriciated.  Symmetrical Chest wall movement, Good air movement bilaterally, CTAB RRR,No Gallops, Rubs or new Murmurs, No Parasternal Heave +ve B.Sounds, Abd Soft, No tenderness, No organomegaly appriciated, No rebound - guarding or rigidity. No Cyanosis, Clubbing or edema, No new Rash or bruise    Data Review:   Micro Results No results found for this or any previous visit (from the past 240 hour(s)).  Radiology Reports Ct Abdomen Pelvis W Contrast  Result Date: 08/11/2017 CLINICAL DATA:  Acute generalized abdominal pain. EXAM: CT ABDOMEN AND PELVIS WITH CONTRAST TECHNIQUE: Multidetector CT imaging of the abdomen and pelvis was performed using the standard protocol following bolus administration of intravenous contrast. CONTRAST:  75mL ISOVUE-300 IOPAMIDOL (ISOVUE-300) INJECTION  61% COMPARISON:  CT scan of July 16, 2013. FINDINGS: Lower chest: No acute abnormality. Hepatobiliary: No gallstones are noted. Stable hepatic cysts are noted. Pancreas: Unremarkable. No pancreatic ductal dilatation or surrounding inflammatory changes. Spleen: Normal in size without focal abnormality. Adrenals/Urinary Tract: Adrenal glands are unremarkable. Kidneys are normal, without renal calculi, focal lesion, or hydronephrosis. Bladder is unremarkable. Stomach/Bowel: Sigmoid diverticulosis is noted without inflammation. The stomach is unremarkable. The appendix is not definitively visualized. There is severe focal inflammation involving loops of small bowel in the left lower quadrant, most consistent jejunal diverticulitis. 2.9 x 2.3 cm fluid collection is seen in this area concerning for possible abscess or contained perforation. Vascular/Lymphatic: Aortic atherosclerosis. No enlarged abdominal or pelvic lymph nodes. Reproductive: Status post hysterectomy. No adnexal masses. Other: No abdominal wall hernia or abnormality. No abdominopelvic ascites. Musculoskeletal: No acute or significant osseous findings. IMPRESSION: Severe focal inflammation is seen involving several loops of small bowel in the left lower quadrant, concerning for jejunal diverticulitis. Adjacent fluid collection measuring 2.9 x 2.3 cm is noted concerning for possible abscess or contained perforation. Electronically Signed   By: Lupita Raider, M.D.   On: 08/11/2017 18:15     CBC Recent Labs  Lab 08/11/17 1502 08/12/17 0617 08/14/17 0536  WBC 12.1* 6.3 4.7  HGB 12.0 10.5* 9.5*  HCT 36.4 32.2* 28.9*  PLT 132* 136* 159  MCV 91.9 92.8 90.9  MCH 30.3 30.3 29.9  MCHC 33.0 32.6 32.9  RDW 13.7 14.0 13.6  LYMPHSABS  --  1.1  --   MONOABS  --  0.7  --   EOSABS  --  0.1  --   BASOSABS  --  0.0  --     Chemistries  Recent Labs  Lab 08/11/17 1502 08/12/17 0617 08/14/17 0536  NA 134* 139 138  K 4.1 3.6 3.0*  CL 99* 108  109  CO2 27 23 25   GLUCOSE 95 73 118*  BUN 28* 20 7  CREATININE 1.15* 0.87 0.80  CALCIUM 8.8* 8.1* 7.8*  AST 20 15 16   ALT 14 12* 12*  ALKPHOS 60 49 35*  BILITOT 1.4* 1.3* 0.8   ------------------------------------------------------------------------------------------------------------------ No results for input(s): CHOL, HDL, LDLCALC, TRIG, CHOLHDL, LDLDIRECT in the last 72 hours.  No results found for: HGBA1C ------------------------------------------------------------------------------------------------------------------ No results for input(s): TSH, T4TOTAL, T3FREE, THYROIDAB in the last 72 hours.  Invalid input(s): FREET3 ------------------------------------------------------------------------------------------------------------------ No results for input(s): VITAMINB12, FOLATE, FERRITIN, TIBC, IRON, RETICCTPCT in the last 72 hours.  Coagulation profile Recent Labs  Lab 08/12/17 0617  INR 1.17    No results for input(s): DDIMER in  the last 72 hours.  Cardiac Enzymes No results for input(s): CKMB, TROPONINI, MYOGLOBIN in the last 168 hours.  Invalid input(s): CK ------------------------------------------------------------------------------------------------------------------ No results found for: BNP   Total time.  35 minutes  Signature  Susa RaringPrashant Raynaldo Falco M.D on 08/14/2017 at 12:28 PM  Between 7am to 7pm - Pager - [phone] ( page via [email or web address], text pages only, please mention full 10 digit call back number).  After 7pm go to [email or web address] - password [email or web address]

## 2017-08-14 NOTE — Care Management Important Message (Signed)
Important Message  Patient Details  Name: [name] MRN: [id number] Date of Birth: 05-18-1931   Medicare Important Message Given:  Yes    Caren MacadamFuller, Mavis Fichera 08/14/2017, 10:35 AMImportant Message  Patient Details  Name: [name] MRN: [id number] Date of Birth: 05-18-1931   Medicare Important Message Given:  Yes    Caren MacadamFuller, Jasreet Dickie 08/14/2017, 10:35 AM

## 2017-08-14 NOTE — Progress Notes (Signed)
Patient ID: Sara Humphrey, female   DOB: 07-Jul-1931, 83 y.o.   MRN: [id number]  [facility]     Subjective: CC-  No complaints. States that abdominal pain has resolved. Denies n/v. Has been ambulating in the halls without assistance. Has not eaten in 6 days but feels a little hungry. Had a small BM yesterday. WBC 4.7, afebrile.  Lives at home with husband and son.  Objective: Vital signs in last 24 hours: Temp:  [98.1 F (36.7 C)-98.6 F (37 C)] (P) 98.1 F (36.7 C) (11/26 0514) Pulse Rate:  [58-63] (P) 63 (11/26 0514) Resp:  [14-16] (P) 16 (11/26 0514) BP: (124)/(60) (P) 111/47 (11/26 0514) SpO2:  [98 %-100 %] (P) 99 % (11/26 0514) Last BM Date: 08/13/17  Intake/Output from previous day: 11/25 0701 - 11/26 0700 In: 3010 [P.O.:60; I.V.:2800; IV Piggyback:150] Out: -  Intake/Output this shift: No intake/output data recorded.  PE: Gen:  Alert, NAD, pleasant HEENT: EOM's intact, pupils equal and round Card:  RRR, no M/G/R heard Pulm:  CTAB, no W/R/R, effort normal Abd: Soft, NT/ND, +BS, no HSM, no hernia Ext:  No erythema, edema, or tenderness BUE/BLE  Psych: A&Ox3  Skin: no rashes noted, warm and dry  Lab Results:  Recent Labs    08/12/17 0617 08/14/17 0536  WBC 6.3 4.7  HGB 10.5* 9.5*  HCT 32.2* 28.9*  PLT 136* 159   BMET Recent Labs    08/12/17 0617 08/14/17 0536  NA 139 138  K 3.6 3.0*  CL 108 109  CO2 23 25  GLUCOSE 73 118*  BUN 20 7  CREATININE 0.87 0.80  CALCIUM 8.1* 7.8*   PT/INR Recent Labs    08/12/17 0617  LABPROT 14.8  INR 1.17   CMP     Component Value Date/Time   NA 138 08/14/2017 0536   K 3.0 (L) 08/14/2017 0536   CL 109 08/14/2017 0536   CO2 25 08/14/2017 0536   GLUCOSE 118 (H) 08/14/2017 0536   BUN 7 08/14/2017 0536   CREATININE 0.80 08/14/2017 0536   CALCIUM 7.8 (L) 08/14/2017 0536   PROT 5.5 (L) 08/14/2017 0536   ALBUMIN 2.6 (L) 08/14/2017 0536   AST 16 08/14/2017 0536   ALT 12 (L)  08/14/2017 0536   ALKPHOS 35 (L) 08/14/2017 0536   BILITOT 0.8 08/14/2017 0536   GFRNONAA >60 08/14/2017 0536   GFRAA >60 08/14/2017 0536   Lipase     Component Value Date/Time   LIPASE 23 08/11/2017 1502       Studies/Results: No results found.  Anti-infectives: Anti-infectives (From admission, onward)   Start     Dose/Rate Route Frequency Ordered Stop   08/12/17 0200  piperacillin-tazobactam (ZOSYN) IVPB 3.375 g     3.375 g 12.5 mL/hr over 240 Minutes Intravenous Every 8 hours 08/11/17 2108 08/19/17 0159   08/11/17 1900  piperacillin-tazobactam (ZOSYN) IVPB 3.375 g     3.375 g 100 mL/hr over 30 Minutes Intravenous  Once 08/11/17 1855 08/11/17 2030       Assessment/Plan Diverticulitis of small intestine (likely jejunum) with contained perforation - CT 11/23 showed severe focal inflammation involving several loops of small bowel in the LLQ concerning for jejunal diverticulitis; adjacent fluid collection 2.9 x 2.3 cm - has been on bowel rest and IV abx since admission 11/23 - pain improved, WBC 4.7, afebrile  ID - zosyn 11/23>>day#4 FEN - IVF, NPO VTE - SCDs, lovenox Foley - none Follow up - TBD  Plan -  WBC normalized and pain resolved. Repeat CT scan ordered for today. If CT scan shows improvement will plan to advance to clear liquid diet.   LOS: 3 days    Franne FortsBrooke A Orva Gwaltney , [facility] 08/14/2017, 8:23 AM Pager: [phone] Consults: [phone] Mon-Fri 7:00 am-4:30 pm Sat-Sun 7:00 am-11:30 am

## 2017-08-15 DIAGNOSIS — H4010X0 Unspecified open-angle glaucoma, stage unspecified: Secondary | ICD-10-CM

## 2017-08-15 LAB — BASIC METABOLIC PANEL
ANION GAP: 5 (ref 5–15)
BUN: <5 mg/dL — ABNORMAL LOW (ref 6–20)
CALCIUM: 8.5 mg/dL — AB (ref 8.9–10.3)
CO2: 26 mmol/L (ref 22–32)
Chloride: 110 mmol/L (ref 101–111)
Creatinine, Ser: 0.79 mg/dL (ref 0.44–1.00)
GLUCOSE: 92 mg/dL (ref 65–99)
POTASSIUM: 3.7 mmol/L (ref 3.5–5.1)
SODIUM: 141 mmol/L (ref 135–145)

## 2017-08-15 LAB — CBC
HCT: 31.3 % — ABNORMAL LOW (ref 36.0–46.0)
HEMOGLOBIN: 10.5 g/dL — AB (ref 12.0–15.0)
MCH: 30.3 pg (ref 26.0–34.0)
MCHC: 33.5 g/dL (ref 30.0–36.0)
MCV: 90.5 fL (ref 78.0–100.0)
Platelets: 182 10*3/uL (ref 150–400)
RBC: 3.46 MIL/uL — AB (ref 3.87–5.11)
RDW: 13.8 % (ref 11.5–15.5)
WBC: 4.1 10*3/uL (ref 4.0–10.5)

## 2017-08-15 LAB — GLUCOSE, CAPILLARY
GLUCOSE-CAPILLARY: 99 mg/dL (ref 65–99)
GLUCOSE-CAPILLARY: 80 mg/dL (ref 65–99)
Glucose-Capillary: 83 mg/dL (ref 65–99)

## 2017-08-15 LAB — MAGNESIUM: MAGNESIUM: 2.2 mg/dL (ref 1.7–2.4)

## 2017-08-15 MED ORDER — AMOXICILLIN-POT CLAVULANATE 875-125 MG PO TABS: 1.0000 | ORAL_TABLET | Freq: Two times a day (BID) | ORAL | 0 refills | Status: AC

## 2017-08-15 MED ORDER — DOCUSATE SODIUM 100 MG PO CAPS: 100.0000 mg | ORAL_CAPSULE | Freq: Two times a day (BID) | ORAL | 0 refills | Status: AC | PRN

## 2017-08-15 NOTE — Discharge Summary (Signed)
Sara PenningBetty J Humphrey FAO:[id number] DOB: 11/13/1930 DOA: 08/11/2017  PCP: Lorenda IshiharaVaradarajan, Rupashree, MD  Admit date: 08/11/2017  Discharge date: 08/15/2017  Admitted From: Home   Disposition:  Home   Recommendations for Outpatient Follow-up:   Follow up with PCP in 1-2 weeks  PCP Please obtain BMP/CBC, 2 view CXR in 1week,  (see Discharge instructions)   PCP Please follow up on the following pending results: Monitor CBC, needs close surgery follow up in 5-7 days   Home Health: None Equipment/Devices: None Consultations: Surgery Discharge Condition: Fair   CODE STATUS: Full   Diet Recommendation:  Soft diet for the next 5-7 days then advance to regular consistency heart healthy diet as tolerated   Chief Complaint  Patient presents with  . Abdominal Pain     Brief history of present illness from the day of admission and additional interim summary    Sara PenningBetty J Humphrey is a 83 y.o. female with medical history significant of glaucoma presents with three days steadily worsening abdominal pain that is generalized but worse in the left lower quadrant. Also associated anorexia. No fever, no nausea/vomiting. Pain is not severe but is worse when she presses on her abdomen. Is passing flatus and stool, no diarrhea, no blood in stool. No dysuria or urinary frequency. No palpitations or chest pain. Has not happened before.                                                                  Hospital Course    1. Acute Diverticulitis of Small Bowel (Jejunum) with Abscess - Leukocytosis has resolved, she is pain free and tolerating diet, no fevers, she was kept on IV Zosyn (started 08/11/17), surgery was on board, case discussed with general surgeon on call today, she will be placed on 2 weeks of PO Augmentin and PRN Colace, DC home on  soft diet with outpt PCP and Surgery follow up.  2. Hypoglycemia- due to poor PO intake resolved with gentle  D5 drip.  3. Dehydration and to #1 above upon admission.  Resolved after hydration.  4.  Glaucoma, continue eyedrops.  No acute issues.    Discharge diagnosis     Principal Problem:   Diverticulitis of small intestine with abscess Active Problems:   Acute diverticulitis   Glaucoma    Discharge instructions      Discharge Medications   Allergies as of 08/15/2017   No Known Allergies     Medication List    TAKE these medications   amoxicillin-clavulanate 875-125 MG tablet Commonly known as:  AUGMENTIN Take 1 tablet by mouth 2 (two) times daily.   aspirin EC 81 MG tablet Take 81 mg by mouth daily.   cholecalciferol 1000 units tablet Commonly known as:  VITAMIN D Take 1,000 Units by mouth daily.  docusate sodium 100 MG capsule Commonly known as:  COLACE Take 1 capsule (100 mg total) by mouth 2 (two) times daily as needed for mild constipation.   LUMIGAN 0.01 % Soln Generic drug:  bimatoprost PUT 1 DROP IN BOTH EYES AT BEDTIME   timolol 0.5 % ophthalmic solution Commonly known as:  TIMOPTIC Place 1 drop into both eyes daily.   vitamin C 100 MG tablet Take 100 mg by mouth daily.   VITAMIN E PO Take 1 tablet by mouth daily.       Follow-up Information    Lorenda Ishihara, MD. Schedule an appointment as soon as possible for a visit in 5 day(s).   Specialty:  Internal Medicine Contact information: [address] [place] [address] [phone]        Kinsinger, De Blanch, MD. Schedule an appointment as soon as possible for a visit in 5 day(s).   Specialty:  General Surgery Why:  Or any surgeon in his office for diverticular abscess Contact information: [address] [place] [address] [phone]           Major procedures and Radiology Reports - PLEASE review detailed and final reports  thoroughly  -         Ct Abdomen Pelvis W Contrast  Result Date: 08/14/2017 CLINICAL DATA:  83 year old female with diverticulitis. Feeling better. Resolved leukocytosis. On antibiotics. Subsequent encounter. EXAM: CT ABDOMEN AND PELVIS WITH CONTRAST TECHNIQUE: Multidetector CT imaging of the abdomen and pelvis was performed using the standard protocol following bolus administration of intravenous contrast. CONTRAST:  80mL ISOVUE-300 IOPAMIDOL (ISOVUE-300) INJECTION 61% COMPARISON:  08/11/2017 and 07/16/2013 CT. FINDINGS: Lower chest: Interval development of small pleural effusions and adjacent passive atelectasis greater on the right. Heart top-normal size. Hepatobiliary: Several liver cysts. Additionally posterior right lobe liver 2 cm structure possibly a hemangioma without change. Dilated gallbladder without calcified gallstones. Prominent size common bile duct without calcified common bile duct stone. Pancreas: No pancreatic mass or pancreatic inflammation. Spleen: No worrisome splenic lesion or enlargement. Adrenals/Urinary Tract: Probable tiny cyst left kidney. No obstructing renal calculus or hydronephrosis. No adrenal lesion. Noncontrast filled views of the urinary bladder unremarkable. Stomach/Bowel: Slight improvement of inflammatory process centered at the jejunum. This may be related to jejunal diverticulitis with small focal abscess measuring up to 2.7 cm versus prior 2.9 cm. Now noted is a small amount of free fluid in the pelvis (not a drainable collection) most likely related to jejunal inflammatory process. Prominent diverticulosis throughout remainder of colon most prominent sigmoid colon with associated muscular hypertrophy. Appendix not visualized. Appendix may been removed at time of hysterectomy. Small hiatal hernia. Vascular/Lymphatic: Atherosclerotic changes aorta with slight ectasia without aneurysm. No large vessel occlusion. No adenopathy. Reproductive: Post hysterectomy.  No  worrisome adnexal mass. Other: No free intraperitoneal air. Musculoskeletal: Fusion L4-5 with anterior slip of L4. 8 mm anterior slip L3. No acute osseous abnormality. IMPRESSION: Slight improvement of inflammatory process centered at the jejunum. This may be related to jejunal diverticulitis with small focal abscess measuring up to 2.7 cm versus prior 2.9 cm. Now noted is a small amount of free fluid in the pelvis (not a drainable collection) most likely related to jejunal inflammatory process. Interval development of small pleural effusions greater on the right. Remainder of findings appear similar to prior exam as detailed above Electronically Signed   By: Lacy Duverney M.D.   On: 08/14/2017 13:55   Ct Abdomen Pelvis W Contrast  Result Date: 08/11/2017 CLINICAL DATA:  Acute generalized abdominal pain. EXAM: CT ABDOMEN AND PELVIS WITH CONTRAST TECHNIQUE: Multidetector CT imaging of the abdomen and pelvis was performed using the standard protocol following bolus administration of intravenous contrast. CONTRAST:  75mL ISOVUE-300 IOPAMIDOL (ISOVUE-300) INJECTION 61% COMPARISON:  CT scan of July 16, 2013. FINDINGS: Lower chest: No acute abnormality. Hepatobiliary: No gallstones are noted. Stable hepatic cysts are noted. Pancreas: Unremarkable. No pancreatic ductal dilatation or surrounding inflammatory changes. Spleen: Normal in size without focal abnormality. Adrenals/Urinary Tract: Adrenal glands are unremarkable. Kidneys are normal, without renal calculi, focal lesion, or hydronephrosis. Bladder is unremarkable. Stomach/Bowel: Sigmoid diverticulosis is noted without inflammation. The stomach is unremarkable. The appendix is not definitively visualized. There is severe focal inflammation involving loops of small bowel in the left lower quadrant, most consistent jejunal diverticulitis. 2.9 x 2.3 cm fluid collection is seen in this area concerning for possible abscess or contained perforation.  Vascular/Lymphatic: Aortic atherosclerosis. No enlarged abdominal or pelvic lymph nodes. Reproductive: Status post hysterectomy. No adnexal masses. Other: No abdominal wall hernia or abnormality. No abdominopelvic ascites. Musculoskeletal: No acute or significant osseous findings. IMPRESSION: Severe focal inflammation is seen involving several loops of small bowel in the left lower quadrant, concerning for jejunal diverticulitis. Adjacent fluid collection measuring 2.9 x 2.3 cm is noted concerning for possible abscess or contained perforation. Electronically Signed   By: Lupita Raider, M.D.   On: 08/11/2017 18:15    Micro Results    Recent Results (from the past 240 hour(s))  Culture, Urine     Status: None   Collection Time: 08/12/17  8:30 PM  Result Value Ref Range Status   Specimen Description URINE, CLEAN CATCH  Final   Special Requests NONE  Final   Culture   Final    NO GROWTH Performed at [facility], [address], [place], [place] [address]    Report Status 08/14/2017 FINAL  Final    Today   Subjective    Sara Humphrey today has no headache,no chest abdominal pain,no new weakness tingling or numbness, feels much better wants to go home today.    Objective   Blood pressure (!) 144/71, pulse 66, temperature 98 F (36.7 C), temperature source Oral, resp. rate 16, height 5' (1.524 m), weight 56.7 kg (125 lb), SpO2 100 %.   Intake/Output Summary (Last 24 hours) at 08/15/2017 1058 Last data filed at 08/15/2017 0600 Gross per 24 hour  Intake 910 ml  Output -  Net 910 ml    Exam Awake Alert, Oriented x 3, No new F.N deficits, Normal affect [place].AT,PERRAL Supple Neck,No JVD, No cervical lymphadenopathy appriciated.  Symmetrical Chest wall movement, Good air movement bilaterally, CTAB RRR,No Gallops,Rubs or new Murmurs, No Parasternal Heave +ve B.Sounds, Abd Soft, Non tender, No organomegaly appriciated, No rebound -guarding or rigidity. No Cyanosis, Clubbing or  edema, No new Rash or bruise   Data Review   CBC w Diff:  Lab Results  Component Value Date   WBC 4.1 08/15/2017   HGB 10.5 (L) 08/15/2017   HCT 31.3 (L) 08/15/2017   PLT 182 08/15/2017   LYMPHOPCT 17 08/12/2017   MONOPCT 11 08/12/2017   EOSPCT 2 08/12/2017   BASOPCT 1 08/12/2017    CMP:  Lab Results  Component Value Date   NA 141 08/15/2017   K 3.7 08/15/2017   CL 110 08/15/2017   CO2 26 08/15/2017   BUN <5 (L) 08/15/2017   CREATININE 0.79 08/15/2017  PROT 5.5 (L) 08/14/2017   ALBUMIN 2.6 (L) 08/14/2017   BILITOT 0.8 08/14/2017   ALKPHOS 35 (L) 08/14/2017   AST 16 08/14/2017   ALT 12 (L) 08/14/2017  .   Total Time in preparing paper work, data evaluation and todays exam - 35 minutes  Susa RaringPrashant Levin Dagostino M.D on 08/15/2017 at 10:58 AM  [facility]   Office  [phone]

## 2017-08-15 NOTE — Discharge Instructions (Signed)
Follow with Primary MD Lorenda IshiharaVaradarajan, Rupashree, MD in 7 days also follow with the surgery office within 7 days after discharge  Get CBC, CMP, by Primary MD or SNF MD in 5-7 days   Activity: As tolerated with Full fall precautions use walker/cane & assistance as needed  Disposition Home    Diet: Soft diet for the next 5-7 days then advance to regular consistency heart healthy diet as tolerated.  For Heart failure patients - Check your Weight same time everyday, if you gain over 2 pounds, or you develop in leg swelling, experience more shortness of breath or chest pain, call your Primary MD immediately. Follow Cardiac Low Salt Diet and 1.5 lit/day fluid restriction.  On your next visit with your primary care physician please Get Medicines reviewed and adjusted.  Please request your Prim.MD to go over all Hospital Tests and Procedure/Radiological results at the follow up, please get all Hospital records sent to your Prim MD by signing hospital release before you go home.  If you experience worsening of your admission symptoms, develop shortness of breath, life threatening emergency, suicidal or homicidal thoughts you must seek medical attention immediately by calling 911 or calling your MD immediately  if symptoms less severe.  You Must read complete instructions/literature along with all the possible adverse reactions/side effects for all the Medicines you take and that have been prescribed to you. Take any new Medicines after you have completely understood and accpet all the possible adverse reactions/side effects.

## 2017-08-15 NOTE — Progress Notes (Signed)
CC: abdominal pain.  Subjective: Patient is up walking around with her IV pole.  She is tolerating her clear liquids well.  She denies any pain currently.  On admission she said her whole lower abdomen was tender.  Currently she feels fine.  Objective: Vital signs in last 24 hours: Temp:  [97.6 F (36.4 C)-98.1 F (36.7 C)] 98.1 F (36.7 C) (11/27 0606) Pulse Rate:  [67-73] 73 (11/27 0606) Resp:  [16-18] 16 (11/27 0606) BP: (132-155)/(61-75) 132/61 (11/27 0606) SpO2:  [99 %-100 %] 99 % (11/27 0606) Last BM Date: 08/14/17 720 Po 600 IV Voided x 2 Stool x 2 Afebrile, VSS CT 11/26: Improvement in the inflammatory process centered at the jejunum 2.7 cm versus 2.9, small amount of free fluid in the pelvis not amenable to IR drain.  Interval development of small pleural effusions greater on the right. Intake/Output from previous day: 11/26 0701 - 11/27 0700 In: 1351.7 [P.O.:720; I.V.:481.7; IV Piggyback:150] Out: -  Intake/Output this shift: No intake/output data recorded.  General appearance: alert, cooperative and no distress Resp: clear to auscultation bilaterally.  No shortness of breath. GI: soft, non-tender; bowel sounds normal; no masses,  no organomegaly  Lab Results:  Recent Labs    08/14/17 0536 08/15/17 0521  WBC 4.7 4.1  HGB 9.5* 10.5*  HCT 28.9* 31.3*  PLT 159 182    BMET Recent Labs    08/14/17 0536 08/15/17 0521  NA 138 141  K 3.0* 3.7  CL 109 110  CO2 25 26  GLUCOSE 118* 92  BUN 7 <5*  CREATININE 0.80 0.79  CALCIUM 7.8* 8.5*   PT/INR No results for input(s): LABPROT, INR in the last 72 hours.  Recent Labs  Lab 08/11/17 1502 08/12/17 0617 08/14/17 0536  AST 20 15 16   ALT 14 12* 12*  ALKPHOS 60 49 35*  BILITOT 1.4* 1.3* 0.8  PROT 7.8 6.1* 5.5*  ALBUMIN 3.7 3.0* 2.6*     Lipase     Component Value Date/Time   LIPASE 23 08/11/2017 1502   Prior to Admission medications   Medication Sig Start Date End Date Taking? Authorizing  Provider  Ascorbic Acid (VITAMIN C) 100 MG tablet Take 100 mg by mouth daily.   Yes [provider]  aspirin EC 81 MG tablet Take 81 mg by mouth daily.   Yes [provider]  cholecalciferol (VITAMIN D) 1000 units tablet Take 1,000 Units by mouth daily.   Yes [provider]  LUMIGAN 0.01 % SOLN PUT 1 DROP IN BOTH EYES AT BEDTIME 07/28/17  Yes [provider]  timolol (TIMOPTIC) 0.5 % ophthalmic solution Place 1 drop into both eyes daily.   Yes [provider]  VITAMIN E PO Take 1 tablet by mouth daily.   Yes [provider]      Medications: . aspirin EC  81 mg Oral Daily  . cholecalciferol  1,000 Units Oral Daily  . enoxaparin (LOVENOX) injection  40 mg Subcutaneous Q24H  . glucose  1 tablet Oral Once  . latanoprost  1 drop Both Eyes QHS  . timolol  1 drop Both Eyes Daily   . piperacillin-tazobactam (ZOSYN)  IV Stopped (08/15/17 [id number])    Assessment/Plan Diverticulitis of small intestine (likely jejunum) with contained perforation - CT 11/23 showed severe focal inflammation involving several loops of small bowel in the LLQ concerning for jejunal diverticulitis; adjacent fluid collection 2.9 x 2.3 cm - CT 11/26 improvement inflammatory process at the jejunum.  Focal abscess 2.7 cm versus 2.9 cm on last - Clear liquids started - pain improved, WBC 4.1, afebrile  Small bilateral pleural effusions: right greater than left   ID - zosyn 11/23 =->> day#5 FEN - IVF, clear liquids VTE - SCDs, lovenox Foley - none Follow up - TBD  Plan: Continue IV antibiotics today.  Advance to full liquid diet.  She does not have incentive spirometry, I have asked him to get her one.  Continue to mobilize.  Recheck her WBC tomorrow if continues to remain normal will consider transition to oral antibiotics and soft diet/discharge tomorrow.    PCP:  Lorenda IshiharaVaradarajan, Rupashree, MD   LOS: 4 days    Sherrie GeorgeJENNINGS,Raymar Joiner 08/15/2017 [phone]

## 2017-08-15 NOTE — Progress Notes (Signed)
Reviewed and AVS with pt and family. They verbalized understanding and questions were answered to their satisfaction. Pt discharged home in stable condition w/printed prescriptions and all belongings via WC.

## 2019-05-09 IMAGING — CT CT ABD-PELV W/ CM
2 of 5 series · 16 of 46 positions shown, 18 images · IV contrast (ISOVUE)
Comparison: CT scan of July 16, 2013.

CLINICAL DATA: Acute generalized abdominal pain.

EXAM:
CT ABDOMEN AND PELVIS WITH CONTRAST
TECHNIQUE: Multidetector CT imaging of the abdomen and pelvis was performed
using the standard protocol following bolus administration of
intravenous contrast.
CONTRAST:  75mL 0HEHK2-8XX IOPAMIDOL (0HEHK2-8XX) INJECTION 61%

[Series 2: abd/pel with · axial · 0.74mm/px · z∈[-412,-42]mm · 13 of 84 slices shown, 15 images]
[im 5/84  soft-tissue]
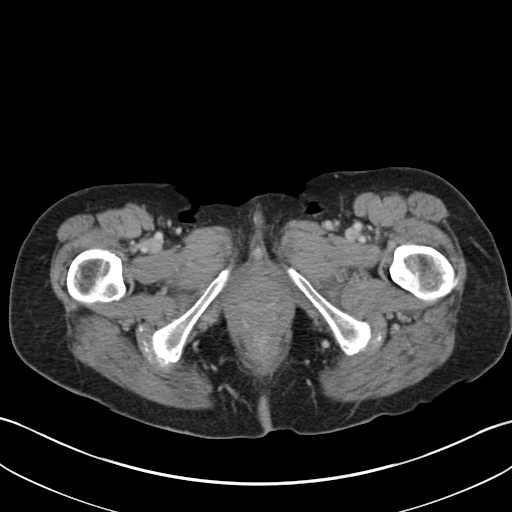
[im 5/84  bone]
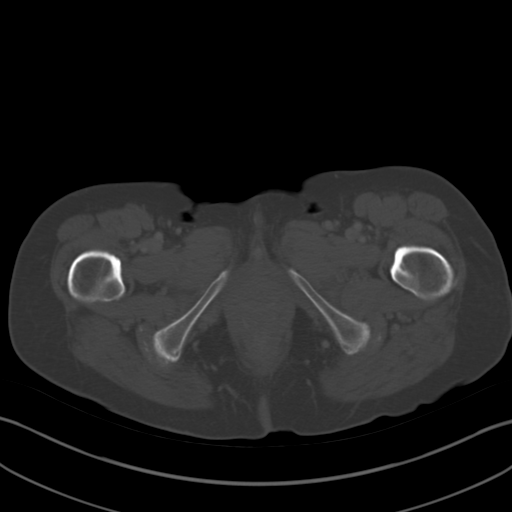
[im 10/84  soft-tissue]
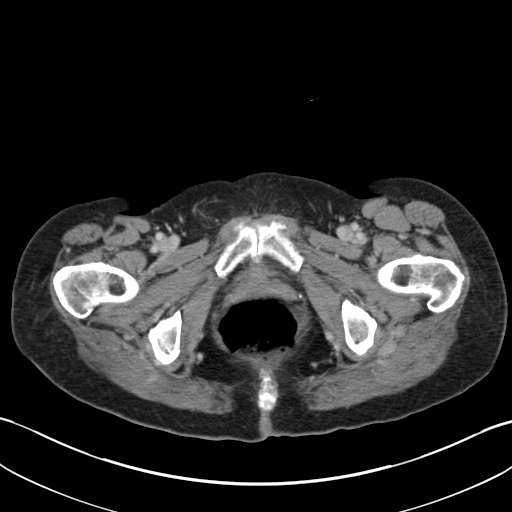
[im 20/84  soft-tissue]
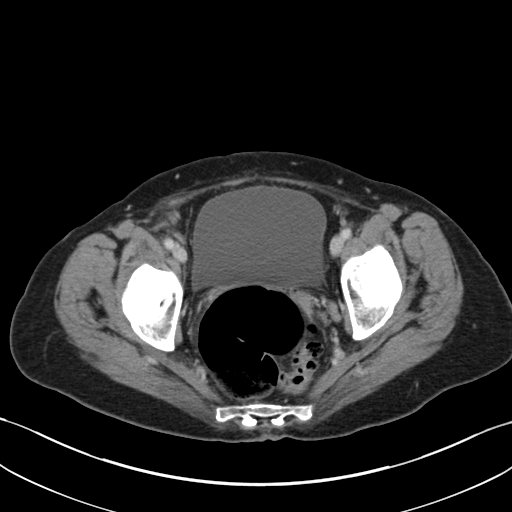
[im 25/84  soft-tissue]
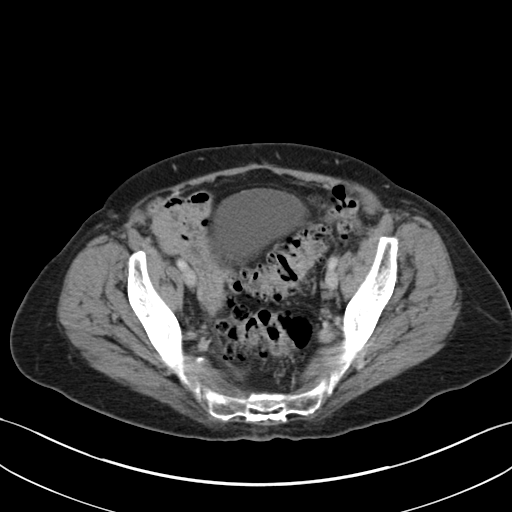
[im 30/84  soft-tissue]
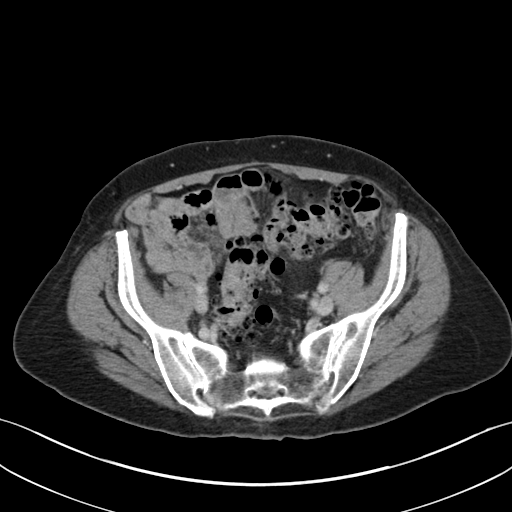
[im 35/84  soft-tissue]
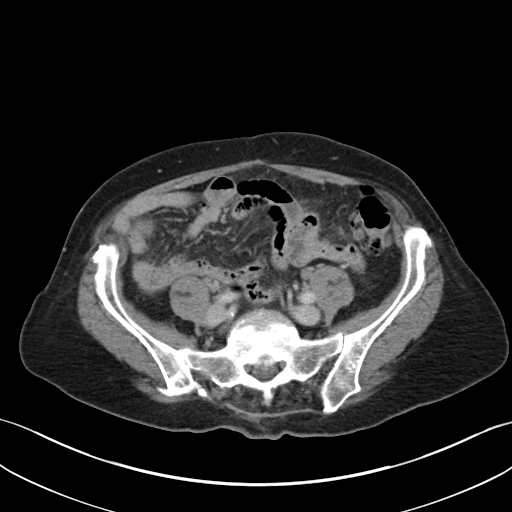
[im 44/84  soft-tissue]
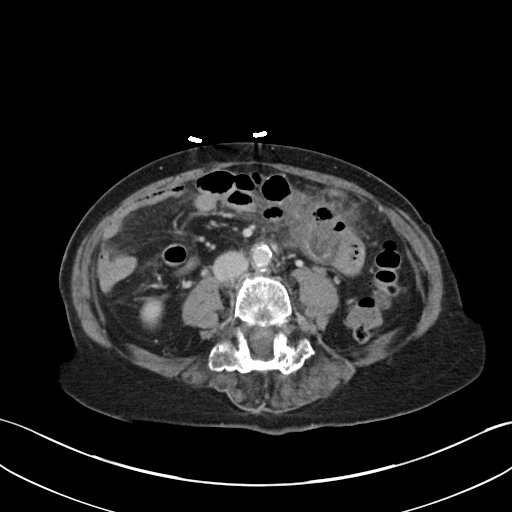
[im 49/84  soft-tissue]
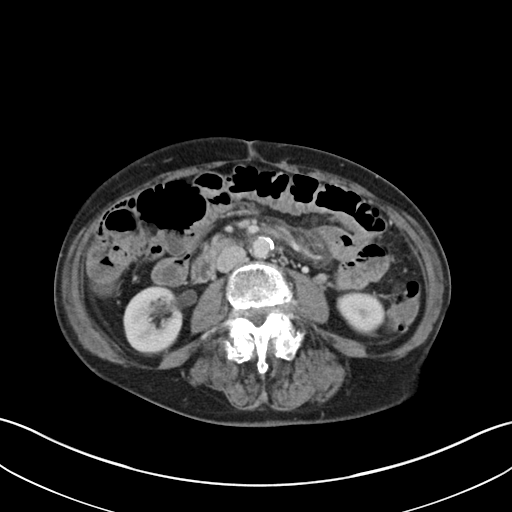
[im 54/84  soft-tissue]
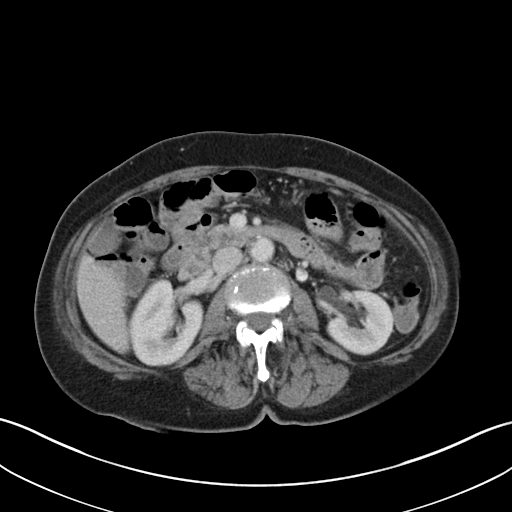
[im 54/84  bone]
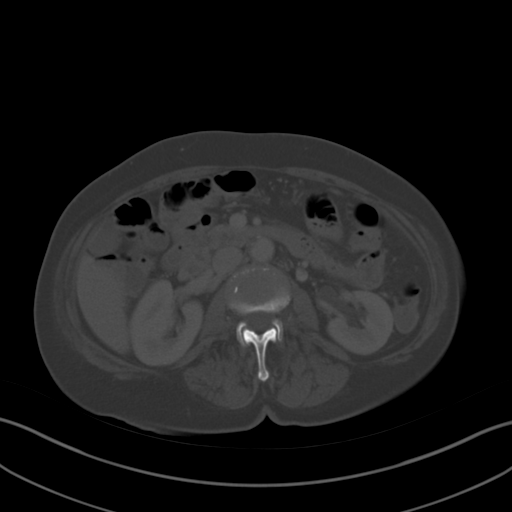
[im 59/84  soft-tissue]
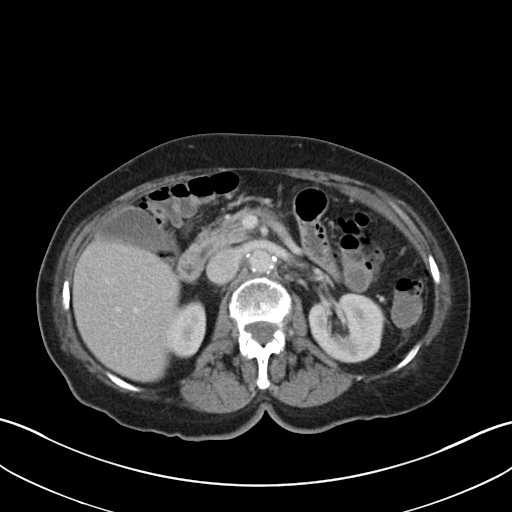
[im 64/84  soft-tissue]
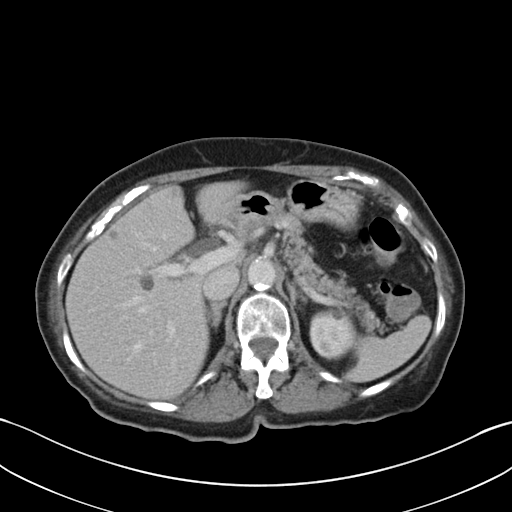
[im 74/84  soft-tissue]
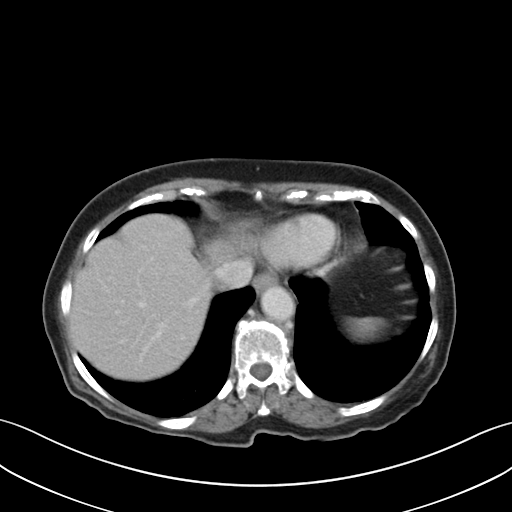
[im 79/84  soft-tissue]
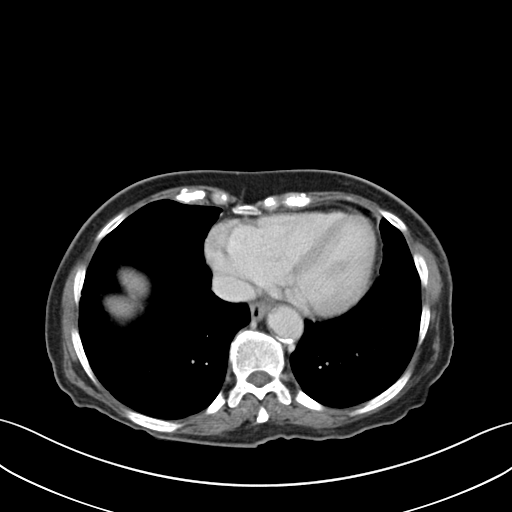

[Series 5: coronal a/|p · coronal · 0.71mm/px · 3 of 110 slices shown]
[im 37/110  soft-tissue]
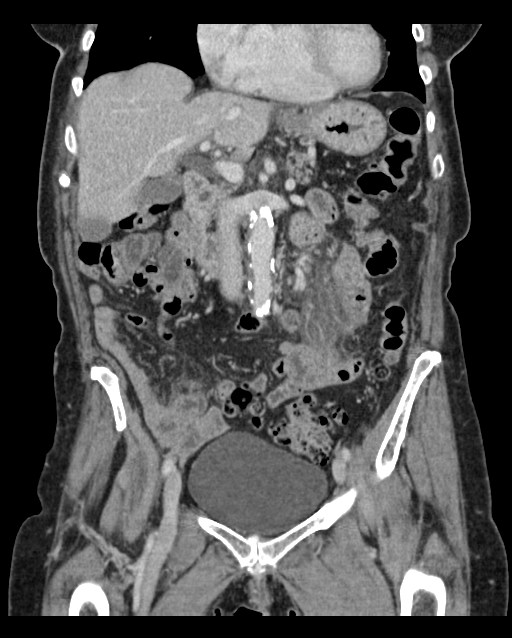
[im 49/110  soft-tissue]
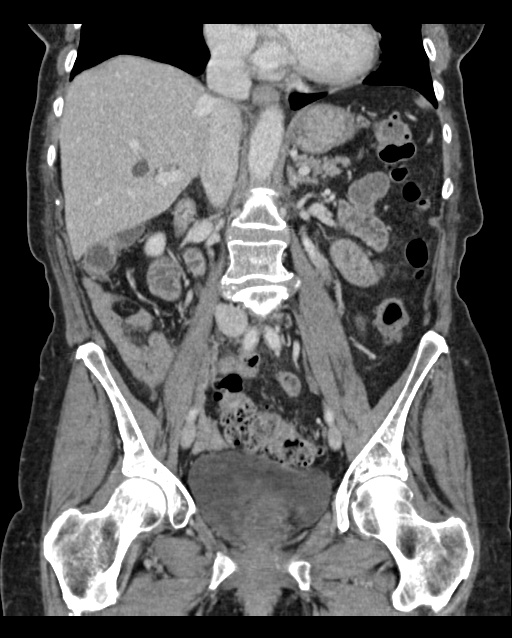
[im 61/110  soft-tissue]
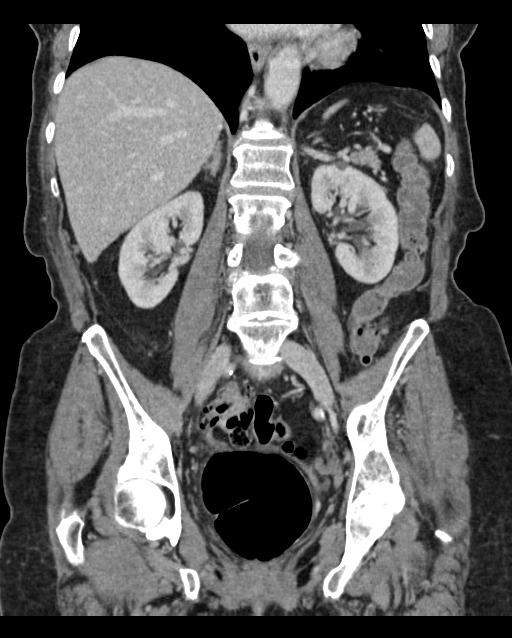

[16 of 46 positions shown; findings below may reference images not displayed]

FINDINGS: Lower chest: No acute abnormality.

Hepatobiliary: No gallstones are noted. Stable hepatic cysts are
noted.

Pancreas: Unremarkable. No pancreatic ductal dilatation or
surrounding inflammatory changes.

Spleen: Normal in size without focal abnormality.

Adrenals/Urinary Tract: Adrenal glands are unremarkable. Kidneys are
normal, without renal calculi, focal lesion, or hydronephrosis.
Bladder is unremarkable.

Stomach/Bowel: Sigmoid diverticulosis is noted without inflammation.
The stomach is unremarkable. The appendix is not definitively
visualized. There is severe focal inflammation involving loops of
small bowel in the left lower quadrant, most consistent jejunal
diverticulitis. 2.9 x 2.3 cm fluid collection is seen in this area
concerning for possible abscess or contained perforation.

Vascular/Lymphatic: Aortic atherosclerosis. No enlarged abdominal or
pelvic lymph nodes.

Reproductive: Status post hysterectomy. No adnexal masses.

Other: No abdominal wall hernia or abnormality. No abdominopelvic
ascites.

Musculoskeletal: No acute or significant osseous findings.
IMPRESSION: Severe focal inflammation is seen involving several loops of small
bowel in the left lower quadrant, concerning for jejunal
diverticulitis. Adjacent fluid collection measuring 2.9 x 2.3 cm is
noted concerning for possible abscess or contained perforation.

## 2019-10-10 ENCOUNTER — Ambulatory Visit: Payer: Medicare PPO | Attending: Family Medicine

## 2019-10-10 DIAGNOSIS — Z23 Encounter for immunization: Secondary | ICD-10-CM | POA: Insufficient documentation

## 2019-10-10 NOTE — Progress Notes (Signed)
   Covid-19 Vaccination Clinic  Name:  YONEKO TALERICO    MRN: [id number] DOB: 11/06/1930  10/10/2019  Ms. Thew was observed post Covid-19 immunization for 15 minutes without incidence. She was provided with Vaccine Information Sheet and instruction to access the V-Safe system.   Ms. Mostafa was instructed to call 911 with any severe reactions post vaccine: Marland Kitchen Difficulty breathing  . Swelling of your face and throat  . A fast heartbeat  . A bad rash all over your body  . Dizziness and weakness    Immunizations Administered    Name Date Dose VIS Date Route   Pfizer COVID-19 Vaccine 10/10/2019 10:12 AM 0.3 mL 08/30/2019 Intramuscular   Manufacturer: [employer], [employer]   Lot: [id number]   NDC: [address]-2

## 2019-10-16 ENCOUNTER — Ambulatory Visit: Payer: Medicare Other

## 2019-10-30 ENCOUNTER — Ambulatory Visit: Payer: Medicare PPO | Attending: Internal Medicine

## 2019-10-30 DIAGNOSIS — Z23 Encounter for immunization: Secondary | ICD-10-CM | POA: Insufficient documentation

## 2019-10-30 NOTE — Progress Notes (Signed)
   Covid-19 Vaccination Clinic  Name:  Sara Humphrey    MRN: [id number] DOB: [birth year]  10/30/2019  Sara Humphrey was observed post Covid-19 immunization for 15 minutes without incidence. She was provided with Vaccine Information Sheet and instruction to access the V-Safe system.   Sara Humphrey was instructed to call 911 with any severe reactions post vaccine: Marland Kitchen Difficulty breathing  . Swelling of your face and throat  . A fast heartbeat  . A bad rash all over your body  . Dizziness and weakness    Immunizations Administered    Name Date Dose VIS Date Route   Pfizer COVID-19 Vaccine 10/30/2019  3:13 PM 0.3 mL 08/30/2019 Intramuscular   Manufacturer: [employer], [employer]   Lot: [id number]   NDC: [address]-6

## 2020-05-22 ENCOUNTER — Other Ambulatory Visit: Payer: Self-pay | Admitting: Internal Medicine

## 2020-05-22 DIAGNOSIS — M85859 Other specified disorders of bone density and structure, unspecified thigh: Secondary | ICD-10-CM

## 2020-06-05 ENCOUNTER — Other Ambulatory Visit: Payer: Self-pay

## 2020-06-05 ENCOUNTER — Ambulatory Visit
Admission: RE | Admit: 2020-06-05 | Discharge: 2020-06-05 | Disposition: A | Discharge status: Home / Self Care | Payer: Medicare PPO | Source: Ambulatory Visit | Attending: Internal Medicine | Admitting: Internal Medicine

## 2020-06-05 DIAGNOSIS — M85859 Other specified disorders of bone density and structure, unspecified thigh: Secondary | ICD-10-CM

## 2020-07-22 DIAGNOSIS — H43812 Vitreous degeneration, left eye: Secondary | ICD-10-CM | POA: Diagnosis not present

## 2020-07-22 DIAGNOSIS — H18593 Other hereditary corneal dystrophies, bilateral: Secondary | ICD-10-CM | POA: Diagnosis not present

## 2020-07-22 DIAGNOSIS — H401132 Primary open-angle glaucoma, bilateral, moderate stage: Secondary | ICD-10-CM | POA: Diagnosis not present

## 2020-07-22 DIAGNOSIS — Z961 Presence of intraocular lens: Secondary | ICD-10-CM | POA: Diagnosis not present

## 2021-01-27 DIAGNOSIS — H43812 Vitreous degeneration, left eye: Secondary | ICD-10-CM | POA: Diagnosis not present

## 2021-01-27 DIAGNOSIS — H401131 Primary open-angle glaucoma, bilateral, mild stage: Secondary | ICD-10-CM | POA: Diagnosis not present

## 2021-01-27 DIAGNOSIS — H18593 Other hereditary corneal dystrophies, bilateral: Secondary | ICD-10-CM | POA: Diagnosis not present

## 2021-01-27 DIAGNOSIS — Z961 Presence of intraocular lens: Secondary | ICD-10-CM | POA: Diagnosis not present

## 2021-05-28 DIAGNOSIS — Z0000 Encounter for general adult medical examination without abnormal findings: Secondary | ICD-10-CM | POA: Diagnosis not present

## 2021-05-28 DIAGNOSIS — R413 Other amnesia: Secondary | ICD-10-CM | POA: Diagnosis not present

## 2021-05-28 DIAGNOSIS — Z1389 Encounter for screening for other disorder: Secondary | ICD-10-CM | POA: Diagnosis not present

## 2021-05-28 DIAGNOSIS — M85859 Other specified disorders of bone density and structure, unspecified thigh: Secondary | ICD-10-CM | POA: Diagnosis not present

## 2021-05-28 DIAGNOSIS — J309 Allergic rhinitis, unspecified: Secondary | ICD-10-CM | POA: Diagnosis not present

## 2021-05-28 DIAGNOSIS — E785 Hyperlipidemia, unspecified: Secondary | ICD-10-CM | POA: Diagnosis not present

## 2021-05-28 DIAGNOSIS — Z23 Encounter for immunization: Secondary | ICD-10-CM | POA: Diagnosis not present

## 2021-05-28 DIAGNOSIS — E781 Pure hyperglyceridemia: Secondary | ICD-10-CM | POA: Diagnosis not present

## 2021-06-16 DIAGNOSIS — H409 Unspecified glaucoma: Secondary | ICD-10-CM | POA: Diagnosis not present

## 2021-06-16 DIAGNOSIS — R32 Unspecified urinary incontinence: Secondary | ICD-10-CM | POA: Diagnosis not present

## 2021-06-16 DIAGNOSIS — E785 Hyperlipidemia, unspecified: Secondary | ICD-10-CM | POA: Diagnosis not present

## 2021-06-16 DIAGNOSIS — M1990 Unspecified osteoarthritis, unspecified site: Secondary | ICD-10-CM | POA: Diagnosis not present

## 2021-06-16 DIAGNOSIS — I739 Peripheral vascular disease, unspecified: Secondary | ICD-10-CM | POA: Diagnosis not present

## 2021-06-16 DIAGNOSIS — R030 Elevated blood-pressure reading, without diagnosis of hypertension: Secondary | ICD-10-CM | POA: Diagnosis not present

## 2021-08-11 DIAGNOSIS — H401131 Primary open-angle glaucoma, bilateral, mild stage: Secondary | ICD-10-CM | POA: Diagnosis not present

## 2021-10-20 DIAGNOSIS — M8580 Other specified disorders of bone density and structure, unspecified site: Secondary | ICD-10-CM | POA: Diagnosis not present

## 2021-10-20 DIAGNOSIS — M1990 Unspecified osteoarthritis, unspecified site: Secondary | ICD-10-CM | POA: Diagnosis not present

## 2021-10-20 DIAGNOSIS — H409 Unspecified glaucoma: Secondary | ICD-10-CM | POA: Diagnosis not present

## 2021-10-20 DIAGNOSIS — I951 Orthostatic hypotension: Secondary | ICD-10-CM | POA: Diagnosis not present

## 2021-10-20 DIAGNOSIS — R030 Elevated blood-pressure reading, without diagnosis of hypertension: Secondary | ICD-10-CM | POA: Diagnosis not present

## 2021-10-20 DIAGNOSIS — G8929 Other chronic pain: Secondary | ICD-10-CM | POA: Diagnosis not present

## 2021-10-20 DIAGNOSIS — E785 Hyperlipidemia, unspecified: Secondary | ICD-10-CM | POA: Diagnosis not present

## 2021-10-20 DIAGNOSIS — Z7722 Contact with and (suspected) exposure to environmental tobacco smoke (acute) (chronic): Secondary | ICD-10-CM | POA: Diagnosis not present

## 2021-10-20 DIAGNOSIS — R32 Unspecified urinary incontinence: Secondary | ICD-10-CM | POA: Diagnosis not present

## 2022-02-09 DIAGNOSIS — H18593 Other hereditary corneal dystrophies, bilateral: Secondary | ICD-10-CM | POA: Diagnosis not present

## 2022-02-09 DIAGNOSIS — Z961 Presence of intraocular lens: Secondary | ICD-10-CM | POA: Diagnosis not present

## 2022-02-09 DIAGNOSIS — H401131 Primary open-angle glaucoma, bilateral, mild stage: Secondary | ICD-10-CM | POA: Diagnosis not present

## 2022-02-09 DIAGNOSIS — H43812 Vitreous degeneration, left eye: Secondary | ICD-10-CM | POA: Diagnosis not present

## 2022-06-09 DIAGNOSIS — E538 Deficiency of other specified B group vitamins: Secondary | ICD-10-CM | POA: Diagnosis not present

## 2022-06-09 DIAGNOSIS — K2930 Chronic superficial gastritis without bleeding: Secondary | ICD-10-CM | POA: Diagnosis not present

## 2022-06-09 DIAGNOSIS — E781 Pure hyperglyceridemia: Secondary | ICD-10-CM | POA: Diagnosis not present

## 2022-06-09 DIAGNOSIS — G43009 Migraine without aura, not intractable, without status migrainosus: Secondary | ICD-10-CM | POA: Diagnosis not present

## 2022-06-09 DIAGNOSIS — E785 Hyperlipidemia, unspecified: Secondary | ICD-10-CM | POA: Diagnosis not present

## 2022-06-09 DIAGNOSIS — Z23 Encounter for immunization: Secondary | ICD-10-CM | POA: Diagnosis not present

## 2022-06-09 DIAGNOSIS — Z8711 Personal history of peptic ulcer disease: Secondary | ICD-10-CM | POA: Diagnosis not present

## 2022-06-09 DIAGNOSIS — M85859 Other specified disorders of bone density and structure, unspecified thigh: Secondary | ICD-10-CM | POA: Diagnosis not present

## 2022-06-09 DIAGNOSIS — Z8719 Personal history of other diseases of the digestive system: Secondary | ICD-10-CM | POA: Diagnosis not present

## 2022-06-09 DIAGNOSIS — R413 Other amnesia: Secondary | ICD-10-CM | POA: Diagnosis not present

## 2022-06-09 DIAGNOSIS — Z0000 Encounter for general adult medical examination without abnormal findings: Secondary | ICD-10-CM | POA: Diagnosis not present

## 2022-06-29 ENCOUNTER — Other Ambulatory Visit: Payer: Self-pay | Admitting: Internal Medicine

## 2022-06-29 DIAGNOSIS — M85859 Other specified disorders of bone density and structure, unspecified thigh: Secondary | ICD-10-CM

## 2022-08-22 DIAGNOSIS — Z961 Presence of intraocular lens: Secondary | ICD-10-CM | POA: Diagnosis not present

## 2022-08-22 DIAGNOSIS — H18593 Other hereditary corneal dystrophies, bilateral: Secondary | ICD-10-CM | POA: Diagnosis not present

## 2022-08-22 DIAGNOSIS — H401131 Primary open-angle glaucoma, bilateral, mild stage: Secondary | ICD-10-CM | POA: Diagnosis not present

## 2022-12-21 ENCOUNTER — Ambulatory Visit
Admission: RE | Admit: 2022-12-21 | Discharge: 2022-12-21 | Disposition: A | Discharge status: Home / Self Care | Payer: Medicare PPO | Source: Ambulatory Visit | Attending: Internal Medicine | Admitting: Internal Medicine

## 2022-12-21 DIAGNOSIS — M85852 Other specified disorders of bone density and structure, left thigh: Secondary | ICD-10-CM | POA: Diagnosis not present

## 2022-12-21 DIAGNOSIS — M85859 Other specified disorders of bone density and structure, unspecified thigh: Secondary | ICD-10-CM

## 2022-12-21 DIAGNOSIS — Z780 Asymptomatic menopausal state: Secondary | ICD-10-CM | POA: Diagnosis not present

## 2022-12-21 DIAGNOSIS — M810 Age-related osteoporosis without current pathological fracture: Secondary | ICD-10-CM | POA: Diagnosis not present

## 2023-02-27 DIAGNOSIS — Z961 Presence of intraocular lens: Secondary | ICD-10-CM | POA: Diagnosis not present

## 2023-02-27 DIAGNOSIS — H18593 Other hereditary corneal dystrophies, bilateral: Secondary | ICD-10-CM | POA: Diagnosis not present

## 2023-02-27 DIAGNOSIS — H43812 Vitreous degeneration, left eye: Secondary | ICD-10-CM | POA: Diagnosis not present

## 2023-02-27 DIAGNOSIS — H401131 Primary open-angle glaucoma, bilateral, mild stage: Secondary | ICD-10-CM | POA: Diagnosis not present

## 2023-03-08 DIAGNOSIS — I951 Orthostatic hypotension: Secondary | ICD-10-CM | POA: Diagnosis not present

## 2023-03-08 DIAGNOSIS — M1990 Unspecified osteoarthritis, unspecified site: Secondary | ICD-10-CM | POA: Diagnosis not present

## 2023-03-08 DIAGNOSIS — I739 Peripheral vascular disease, unspecified: Secondary | ICD-10-CM | POA: Diagnosis not present

## 2023-03-08 DIAGNOSIS — M810 Age-related osteoporosis without current pathological fracture: Secondary | ICD-10-CM | POA: Diagnosis not present

## 2023-03-08 DIAGNOSIS — J309 Allergic rhinitis, unspecified: Secondary | ICD-10-CM | POA: Diagnosis not present

## 2023-03-08 DIAGNOSIS — G3184 Mild cognitive impairment, so stated: Secondary | ICD-10-CM | POA: Diagnosis not present

## 2023-03-08 DIAGNOSIS — H409 Unspecified glaucoma: Secondary | ICD-10-CM | POA: Diagnosis not present

## 2023-03-08 DIAGNOSIS — E785 Hyperlipidemia, unspecified: Secondary | ICD-10-CM | POA: Diagnosis not present

## 2023-03-08 DIAGNOSIS — R32 Unspecified urinary incontinence: Secondary | ICD-10-CM | POA: Diagnosis not present

## 2023-06-21 DIAGNOSIS — E785 Hyperlipidemia, unspecified: Secondary | ICD-10-CM | POA: Diagnosis not present

## 2023-06-21 DIAGNOSIS — Z1331 Encounter for screening for depression: Secondary | ICD-10-CM | POA: Diagnosis not present

## 2023-06-21 DIAGNOSIS — Z8711 Personal history of peptic ulcer disease: Secondary | ICD-10-CM | POA: Diagnosis not present

## 2023-06-21 DIAGNOSIS — L989 Disorder of the skin and subcutaneous tissue, unspecified: Secondary | ICD-10-CM | POA: Diagnosis not present

## 2023-06-21 DIAGNOSIS — D696 Thrombocytopenia, unspecified: Secondary | ICD-10-CM | POA: Diagnosis not present

## 2023-06-21 DIAGNOSIS — Z0000 Encounter for general adult medical examination without abnormal findings: Secondary | ICD-10-CM | POA: Diagnosis not present

## 2023-06-21 DIAGNOSIS — J309 Allergic rhinitis, unspecified: Secondary | ICD-10-CM | POA: Diagnosis not present

## 2023-06-21 DIAGNOSIS — R413 Other amnesia: Secondary | ICD-10-CM | POA: Diagnosis not present

## 2023-06-21 DIAGNOSIS — I700 Atherosclerosis of aorta: Secondary | ICD-10-CM | POA: Diagnosis not present

## 2023-06-21 DIAGNOSIS — M818 Other osteoporosis without current pathological fracture: Secondary | ICD-10-CM | POA: Diagnosis not present

## 2023-08-30 DIAGNOSIS — H18593 Other hereditary corneal dystrophies, bilateral: Secondary | ICD-10-CM | POA: Diagnosis not present

## 2023-08-30 DIAGNOSIS — H401131 Primary open-angle glaucoma, bilateral, mild stage: Secondary | ICD-10-CM | POA: Diagnosis not present

## 2023-10-12 ENCOUNTER — Ambulatory Visit: Payer: Medicare PPO | Admitting: Podiatry

## 2023-10-12 ENCOUNTER — Encounter: Payer: Self-pay | Admitting: Podiatry

## 2023-10-12 DIAGNOSIS — B351 Tinea unguium: Secondary | ICD-10-CM | POA: Diagnosis not present

## 2023-10-12 DIAGNOSIS — M79675 Pain in left toe(s): Secondary | ICD-10-CM | POA: Diagnosis not present

## 2023-10-12 DIAGNOSIS — M79674 Pain in right toe(s): Secondary | ICD-10-CM | POA: Diagnosis not present

## 2023-10-12 NOTE — Progress Notes (Signed)
  Subjective:  Patient ID: Sara Humphrey, female    DOB: Sep 16, 1931,   MRN: [id number]  Chief Complaint  Patient presents with   Nail Problem    Pt presents  for a long and think toe nail great toe on the right states is sore sometimes.    89 y.o. female presents for concern of right great toenail changes. Relates this has been ongoing for years. Relates difficulty with trimming nails Relates pain in the nails and cannot take care of them herself.   . Denies any other pedal complaints. Denies n/v/f/c.   Past Medical History:  Diagnosis Date   Glaucoma     Objective:  Physical Exam: Vascular: DP/PT pulses 2/4 bilateral. CFT <3 seconds. Normal hair growth on digits. No edema.  Skin. No lacerations or abrasions bilateral feet. Nails 1-5 bilateral are thickened dystrophic and elongated.  Musculoskeletal: MMT 5/5 bilateral lower extremities in DF, PF, Inversion and Eversion. Deceased ROM in DF of ankle joint.  Neurological: Sensation intact to light touch.   Assessment:   1. Pain due to onychomycosis of toenails of both feet      Plan:  Patient was evaluated and treated and all questions answered. -Mechanically debrided all nails 1-5 bilateral using sterile nail nipper and filed with dremel without incident as courtesy today.  -Answered all patient questions -Patient to return  in 3 months for at risk foot care -Patient advised to call the office if any problems or questions arise in the meantime.   Louann Sjogren, DPM

## 2024-01-11 ENCOUNTER — Ambulatory Visit: Payer: Medicare PPO | Admitting: Podiatry

## 2024-01-11 DIAGNOSIS — M79675 Pain in left toe(s): Secondary | ICD-10-CM | POA: Diagnosis not present

## 2024-01-11 DIAGNOSIS — B351 Tinea unguium: Secondary | ICD-10-CM | POA: Diagnosis not present

## 2024-01-11 DIAGNOSIS — M79674 Pain in right toe(s): Secondary | ICD-10-CM | POA: Diagnosis not present

## 2024-01-11 NOTE — Progress Notes (Signed)
  Subjective:  Patient ID: Sara Humphrey, female    DOB: 24-Feb-1931,   MRN: [id number]  No chief complaint on file.   89 y.o. female presents for concern of right great toenail changes. Relates this has been ongoing for years. Relates difficulty with trimming nails Relates pain in the nails and cannot take care of them herself.   . Denies any other pedal complaints. Denies n/v/f/c.   Past Medical History:  Diagnosis Date   Glaucoma     Objective:  Physical Exam: Vascular: DP/PT pulses 2/4 bilateral. CFT <3 seconds. Normal hair growth on digits. No edema.  Skin. No lacerations or abrasions bilateral feet. Nails 1-5 bilateral are thickened dystrophic and elongated.  Musculoskeletal: MMT 5/5 bilateral lower extremities in DF, PF, Inversion and Eversion. Deceased ROM in DF of ankle joint.  Neurological: Sensation intact to light touch.   Assessment:   1. Pain due to onychomycosis of toenails of both feet      Plan:  Patient was evaluated and treated and all questions answered. -ABN signed -Mechanically debrided all nails 1-5 bilateral using sterile nail nipper and filed with dremel without incident as courtesy today.  -Answered all patient questions -Patient to return  in 3 months for at risk foot care -Patient advised to call the office if any problems or questions arise in the meantime.   Jennefer Moats, DPM

## 2024-02-23 ENCOUNTER — Other Ambulatory Visit: Payer: Self-pay | Admitting: Internal Medicine

## 2024-02-23 DIAGNOSIS — I700 Atherosclerosis of aorta: Secondary | ICD-10-CM | POA: Diagnosis not present

## 2024-02-23 DIAGNOSIS — F0390 Unspecified dementia without behavioral disturbance: Secondary | ICD-10-CM | POA: Diagnosis not present

## 2024-02-23 DIAGNOSIS — E538 Deficiency of other specified B group vitamins: Secondary | ICD-10-CM | POA: Diagnosis not present

## 2024-02-23 DIAGNOSIS — N289 Disorder of kidney and ureter, unspecified: Secondary | ICD-10-CM | POA: Diagnosis not present

## 2024-02-23 DIAGNOSIS — E785 Hyperlipidemia, unspecified: Secondary | ICD-10-CM | POA: Diagnosis not present

## 2024-02-23 DIAGNOSIS — W19XXXA Unspecified fall, initial encounter: Secondary | ICD-10-CM | POA: Diagnosis not present

## 2024-02-28 DIAGNOSIS — H18593 Other hereditary corneal dystrophies, bilateral: Secondary | ICD-10-CM | POA: Diagnosis not present

## 2024-02-28 DIAGNOSIS — H401131 Primary open-angle glaucoma, bilateral, mild stage: Secondary | ICD-10-CM | POA: Diagnosis not present

## 2024-02-28 DIAGNOSIS — H43812 Vitreous degeneration, left eye: Secondary | ICD-10-CM | POA: Diagnosis not present

## 2024-02-28 DIAGNOSIS — Z961 Presence of intraocular lens: Secondary | ICD-10-CM | POA: Diagnosis not present

## 2024-03-05 ENCOUNTER — Ambulatory Visit
Admission: RE | Admit: 2024-03-05 | Discharge: 2024-03-05 | Disposition: A | Discharge status: Home / Self Care | Source: Ambulatory Visit | Attending: Internal Medicine | Admitting: Internal Medicine

## 2024-03-05 DIAGNOSIS — R9082 White matter disease, unspecified: Secondary | ICD-10-CM | POA: Diagnosis not present

## 2024-03-05 DIAGNOSIS — F0390 Unspecified dementia without behavioral disturbance: Secondary | ICD-10-CM

## 2024-03-05 DIAGNOSIS — G319 Degenerative disease of nervous system, unspecified: Secondary | ICD-10-CM | POA: Diagnosis not present

## 2024-04-11 ENCOUNTER — Encounter: Payer: Self-pay | Admitting: Podiatry

## 2024-04-11 ENCOUNTER — Ambulatory Visit: Admitting: Podiatry

## 2024-04-11 DIAGNOSIS — M79675 Pain in left toe(s): Secondary | ICD-10-CM | POA: Diagnosis not present

## 2024-04-11 DIAGNOSIS — M79674 Pain in right toe(s): Secondary | ICD-10-CM | POA: Diagnosis not present

## 2024-04-11 DIAGNOSIS — B351 Tinea unguium: Secondary | ICD-10-CM

## 2024-04-11 NOTE — Progress Notes (Signed)
  Subjective:  Patient ID: Sara Humphrey, female    DOB: [birth year],   MRN: [id number]  Chief Complaint  Patient presents with   Nail Problem    Trim her nails, concentrate on the big toenail on her right foot.    89 y.o. female presents for concern of right great toenail changes. Relates this has been ongoing for years. Relates difficulty with trimming nails Relates pain in the nails and cannot take care of them herself.   . Denies any other pedal complaints. Denies n/v/f/c.   Past Medical History:  Diagnosis Date   Glaucoma     Objective:  Physical Exam: Vascular: DP/PT pulses 2/4 bilateral. CFT <3 seconds. Normal hair growth on digits. No edema.  Skin. No lacerations or abrasions bilateral feet. Nails 1-5 bilateral are thickened dystrophic and elongated.  Musculoskeletal: MMT 5/5 bilateral lower extremities in DF, PF, Inversion and Eversion. Deceased ROM in DF of ankle joint.  Neurological: Sensation intact to light touch.   Assessment:   1. Pain due to onychomycosis of toenails of both feet      Plan:  Patient was evaluated and treated and all questions answered. -ABN signed -Mechanically debrided all nails 1-5 bilateral using sterile nail nipper and filed with dremel without incident as courtesy today.  -Answered all patient questions -Patient to return  in 3 months for at risk foot care -Patient advised to call the office if any problems or questions arise in the meantime.   Asberry Failing, DPM

## 2024-04-22 DIAGNOSIS — I700 Atherosclerosis of aorta: Secondary | ICD-10-CM | POA: Diagnosis not present

## 2024-04-22 DIAGNOSIS — M818 Other osteoporosis without current pathological fracture: Secondary | ICD-10-CM | POA: Diagnosis not present

## 2024-04-22 DIAGNOSIS — F0390 Unspecified dementia without behavioral disturbance: Secondary | ICD-10-CM | POA: Diagnosis not present

## 2024-04-22 DIAGNOSIS — E785 Hyperlipidemia, unspecified: Secondary | ICD-10-CM | POA: Diagnosis not present

## 2024-04-22 DIAGNOSIS — Z8711 Personal history of peptic ulcer disease: Secondary | ICD-10-CM | POA: Diagnosis not present

## 2024-04-28 ENCOUNTER — Emergency Department (HOSPITAL_COMMUNITY)
Admission: EM | Admit: 2024-04-28 | Discharge: 2024-04-28 | Disposition: A | Discharge status: Home / Self Care | Attending: Emergency Medicine | Admitting: Emergency Medicine

## 2024-04-28 ENCOUNTER — Emergency Department (HOSPITAL_COMMUNITY)

## 2024-04-28 DIAGNOSIS — I440 Atrioventricular block, first degree: Secondary | ICD-10-CM | POA: Diagnosis not present

## 2024-04-28 DIAGNOSIS — R55 Syncope and collapse: Secondary | ICD-10-CM | POA: Insufficient documentation

## 2024-04-28 DIAGNOSIS — F0390 Unspecified dementia without behavioral disturbance: Secondary | ICD-10-CM | POA: Diagnosis not present

## 2024-04-28 DIAGNOSIS — R001 Bradycardia, unspecified: Secondary | ICD-10-CM | POA: Diagnosis not present

## 2024-04-28 DIAGNOSIS — I4430 Unspecified atrioventricular block: Secondary | ICD-10-CM | POA: Diagnosis not present

## 2024-04-28 DIAGNOSIS — Z7982 Long term (current) use of aspirin: Secondary | ICD-10-CM | POA: Diagnosis not present

## 2024-04-28 LAB — URINALYSIS, W/ REFLEX TO CULTURE (INFECTION SUSPECTED)
Bacteria, UA: NONE SEEN
Bilirubin Urine: NEGATIVE
Glucose, UA: NEGATIVE mg/dL
Ketones, ur: NEGATIVE mg/dL
Leukocytes,Ua: NEGATIVE
Nitrite: NEGATIVE
Protein, ur: NEGATIVE mg/dL
Specific Gravity, Urine: 1.009 (ref 1.005–1.030)
pH: 5 (ref 5.0–8.0)

## 2024-04-28 LAB — COMPREHENSIVE METABOLIC PANEL WITH GFR
ALT: 26 U/L (ref 0–44)
AST: 23 U/L (ref 15–41)
Albumin: 3.6 g/dL (ref 3.5–5.0)
Alkaline Phosphatase: 44 U/L (ref 38–126)
Anion gap: 7 (ref 5–15)
BUN: 12 mg/dL (ref 8–23)
CO2: 25 mmol/L (ref 22–32)
Calcium: 9.1 mg/dL (ref 8.9–10.3)
Chloride: 100 mmol/L (ref 98–111)
Creatinine, Ser: 0.89 mg/dL (ref 0.44–1.00)
GFR, Estimated: >60 mL/min (ref >60)
Glucose, Bld: 91 mg/dL (ref 70–99)
Potassium: 4.6 mmol/L (ref 3.5–5.1)
Sodium: 132 mmol/L — ABNORMAL LOW (ref 135–145)
Total Bilirubin: 1 mg/dL (ref 0.0–1.2)
Total Protein: 6.9 g/dL (ref 6.5–8.1)

## 2024-04-28 LAB — CBC WITH DIFFERENTIAL/PLATELET
Abs Immature Granulocytes: 0.01 K/uL (ref 0.00–0.07)
Basophils Absolute: 0.1 K/uL (ref 0.0–0.1)
Basophils Relative: 1 %
Eosinophils Absolute: 0.1 K/uL (ref 0.0–0.5)
Eosinophils Relative: 2 %
HCT: 44 % (ref 36.0–46.0)
Hemoglobin: 13.7 g/dL (ref 12.0–15.0)
Immature Granulocytes: 0 %
Lymphocytes Relative: 17 %
Lymphs Abs: 1.2 K/uL (ref 0.7–4.0)
MCH: 28.4 pg (ref 26.0–34.0)
MCHC: 31.1 g/dL (ref 30.0–36.0)
MCV: 91.1 fL (ref 80.0–100.0)
Monocytes Absolute: 0.6 K/uL (ref 0.1–1.0)
Monocytes Relative: 8 %
Neutro Abs: 5.2 K/uL (ref 1.7–7.7)
Neutrophils Relative %: 72 %
Platelets: 153 K/uL (ref 150–400)
RBC: 4.83 MIL/uL (ref 3.87–5.11)
RDW: 13.6 % (ref 11.5–15.5)
WBC: 7.2 K/uL (ref 4.0–10.5)
nRBC: 0 % (ref 0.0–0.2)

## 2024-04-28 MED ORDER — SODIUM CHLORIDE 0.9 % IV BOLUS
1000.0000 mL | Freq: Once | INTRAVENOUS | Status: AC
  Administered 2024-04-28: 1000 mL via INTRAVENOUS

## 2024-04-28 NOTE — Discharge Instructions (Addendum)
 We evaluated Sara Humphrey for her fainting episode.  Her testing in the emergency department was reassuring.  We feel that it is safe for her to go home at this time.  We would recommend that she follow-up very closely with her primary doctor and return for any new or worsening symptoms such as recurrent fainting, chest pain, difficulty breathing, headaches, nausea or vomiting, or any other new symptoms.

## 2024-04-28 NOTE — ED Triage Notes (Signed)
 Patient in today reporting syncopal episode  at church this morning. Reporting that she was able to have breakfast this morning.  Hx of dementia.

## 2024-04-28 NOTE — ED Provider Notes (Signed)
 [facility] AT [facility] Provider Note  CSN: [id number] Arrival date & time: 04/28/24 1159  Chief Complaint(s) Near Syncope  HPI Sara Humphrey is a 89 y.o. female history of glaucoma presenting to the emergency department with syncope.  Patient reports that she was at church, passed out.  Not able to provide much additional history.  Patient's son reports that she seemed to slump over for a brief period.  Was back to baseline within 5 minutes.  Patient is not complaining of any chest pain, difficulty breathing, fevers or chills, abdominal pain, diarrhea, painful urination or any other new symptoms.  Family reports he had 1 previous episode and was dehydrated.  This occurred around a year ago.  Patient reports she feels normal now.   Past Medical History Past Medical History:  Diagnosis Date   Glaucoma    Patient Active Problem List   Diagnosis Date Noted   Acute diverticulitis 08/11/2017   Glaucoma 08/11/2017   Diverticulitis of small intestine with abscess    Home Medication(s) Prior to Admission medications   Medication Sig Start Date End Date Taking? Authorizing Provider  amoxicillin -clavulanate (AUGMENTIN ) 875-125 MG tablet Take 1 tablet by mouth 2 (two) times daily. 08/15/17   Singh, Prashant K, MD  Ascorbic Acid (VITAMIN C) 100 MG tablet Take 100 mg by mouth daily.    [provider]  aspirin  EC 81 MG tablet Take 81 mg by mouth daily.    [provider]  B Complex Vitamins (B-COMPLEX/B-12) TABS  02/19/24   [provider]  cholecalciferol  (VITAMIN D ) 1000 units tablet Take 1,000 Units by mouth daily.    [provider]  docusate sodium  (COLACE) 100 MG capsule Take 1 capsule (100 mg total) by mouth 2 (two) times daily as needed for mild constipation. 08/15/17   Singh, Prashant K, MD  donepezil (ARICEPT) 10 MG tablet Take 10 mg by mouth at bedtime. 02/23/24   [provider]  LUMIGAN 0.01 % SOLN PUT 1  DROP IN BOTH EYES AT BEDTIME 07/28/17   [provider]  Rosuvastatin Calcium 10 MG CPSP  06/04/21   [provider]  timolol  (TIMOPTIC ) 0.5 % ophthalmic solution Place 1 drop into both eyes daily.    [provider]  VITAMIN E PO Take 1 tablet by mouth daily.    [provider]                                                                                                                                    Past Surgical History Past Surgical History:  Procedure Laterality Date   ABDOMINAL HYSTERECTOMY     BACK SURGERY     fusion   RECTAL PROLAPSE REPAIR N/A 2008   rectal prolapsed into vagina   Family History No family history on file.  Social History Social History   Tobacco Use   Smoking status: Never  Smokeless tobacco: Never  Substance Use Topics   Alcohol use: No   Drug use: No   Allergies Patient has no known allergies.  Review of Systems Review of Systems  All other systems reviewed and are negative.   Physical Exam Vital Signs  I have reviewed the triage vital signs BP (!) 178/72   Pulse 61   Temp 98.1 F (36.7 C) (Oral)   Resp (!) 21   SpO2 100%  Physical Exam Vitals and nursing note reviewed.  Constitutional:      General: She is not in acute distress.    Appearance: She is well-developed.  HENT:     Head: Normocephalic and atraumatic.     Mouth/Throat:     Mouth: Mucous membranes are moist.  Eyes:     Pupils: Pupils are equal, round, and reactive to light.  Cardiovascular:     Rate and Rhythm: Normal rate and regular rhythm.     Heart sounds: No murmur heard. Pulmonary:     Effort: Pulmonary effort is normal. No respiratory distress.     Breath sounds: Normal breath sounds.  Abdominal:     General: Abdomen is flat.     Palpations: Abdomen is soft.     Tenderness: There is no abdominal tenderness.  Musculoskeletal:        General: No tenderness.     Right lower leg: No edema.     Left lower leg: No  edema.  Skin:    General: Skin is warm and dry.  Neurological:     General: No focal deficit present.     Mental Status: She is alert. Mental status is at baseline.  Psychiatric:        Mood and Affect: Mood normal.        Behavior: Behavior normal.     ED Results and Treatments Labs (all labs ordered are listed, but only abnormal results are displayed) Labs Reviewed  COMPREHENSIVE METABOLIC PANEL WITH GFR - Abnormal; Notable for the following components:      Result Value   Sodium 132 (*)    All other components within normal limits  URINALYSIS, W/ REFLEX TO CULTURE (INFECTION SUSPECTED) - Abnormal; Notable for the following components:   Hgb urine dipstick SMALL (*)    All other components within normal limits  CBC WITH DIFFERENTIAL/PLATELET                                                                                                                          Radiology DG Chest Portable 1 View Result Date: 04/28/2024 CLINICAL DATA:  Syncope. EXAM: PORTABLE CHEST 1 VIEW COMPARISON:  CT chest 11/25/2009. FINDINGS: Trachea is midline. Heart size is accentuated by AP semi upright technique. Lungs are clear. No pleural fluid. IMPRESSION: No acute findings. Electronically Signed   By: Newell Eke M.D.   On: 04/28/2024 14:04    Pertinent labs & imaging results that were available during my care of the patient were  reviewed by me and considered in my medical decision making (see MDM for details).  Medications Ordered in ED Medications  sodium chloride  0.9 % bolus 1,000 mL (0 mLs Intravenous Stopped 04/28/24 1625)                                                                                                                                     Procedures Procedures  (including critical care time)  Medical Decision Making / ED Course   MDM:  89 year old presenting to the emergency department with syncope.  Patient overall well-appearing, physical examination without  focal finding.  Differential includes orthostatic syncope, vasovagal syncope, dehydration, cardiogenic syncope although less likely.  No history of CHF.  Patient is completely back to normal currently.  Does have dementia which makes history somewhat limited.  Will reassess.  If laboratory testing is reassuring, EKG reassuring, likely discharge the patient continues to feel well and able to ambulate without difficulty  Clinical Course as of 04/28/24 1932  Sun Apr 28, 2024  1930 Patient well-appearing, denies any ongoing symptoms.  Labs overall reassuring.  No UTI or significant dehydration.  Has been stable.  Feels patient is stable for discharge.  Discussed with patient and her sons.  Will be discharged pending ambulation trial.  Will discharge patient to home. All questions answered. Patient comfortable with plan of discharge. Return precautions discussed with patient and specified on the after visit summary.  [WS]    Clinical Course User Index [WS] Francesca, Elsie CROME, MD     Additional history obtained: -Additional history obtained from family -External records from outside source obtained and reviewed including: Chart review including previous notes, labs, imaging, consultation notes including prior notes    Lab Tests: -I ordered, reviewed, and interpreted labs.   The pertinent results include:   Labs Reviewed  COMPREHENSIVE METABOLIC PANEL WITH GFR - Abnormal; Notable for the following components:      Result Value   Sodium 132 (*)    All other components within normal limits  URINALYSIS, W/ REFLEX TO CULTURE (INFECTION SUSPECTED) - Abnormal; Notable for the following components:   Hgb urine dipstick SMALL (*)    All other components within normal limits  CBC WITH DIFFERENTIAL/PLATELET    Notable for mild low hgb   EKG   EKG Interpretation Date/Time:  Sunday April 28 2024 14:51:11 EDT Ventricular Rate:  55 PR Interval:  296 QRS Duration:  93 QT Interval:  450 QTC  Calculation: 431 R Axis:   -1  Text Interpretation: Sinus rhythm Prolonged PR interval RSR' in V1 or V2, probably normal variant Confirmed by Francesca Elsie ([id number]) on 04/28/2024 5:03:24 PM         Imaging Studies ordered: I ordered imaging studies including CXR On my interpretation imaging demonstrates no acute process I independently visualized and interpreted imaging. I agree with the radiologist interpretation   Medicines ordered and prescription drug management: Meds  ordered this encounter  Medications   sodium chloride  0.9 % bolus 1,000 mL    -I have reviewed the patients home medicines and have made adjustments as needed  Reevaluation: After the interventions noted above, I reevaluated the patient and found that their symptoms have stayed the same  Co morbidities that complicate the patient evaluation  Past Medical History:  Diagnosis Date   Glaucoma       Dispostion: Disposition decision including need for hospitalization was considered, and patient discharged from emergency department.    Final Clinical Impression(s) / ED Diagnoses Final diagnoses:  Syncope, unspecified syncope type     This chart was dictated using voice recognition software.  Despite best efforts to proofread,  errors can occur which can change the documentation meaning.    Francesca Elsie CROME, MD 04/28/24 [phone]

## 2024-04-28 NOTE — ED Notes (Signed)
 Pt ambulated without assistance. No complications.

## 2024-05-15 DIAGNOSIS — E785 Hyperlipidemia, unspecified: Secondary | ICD-10-CM | POA: Diagnosis not present

## 2024-05-15 DIAGNOSIS — R55 Syncope and collapse: Secondary | ICD-10-CM | POA: Diagnosis not present

## 2024-05-15 DIAGNOSIS — E871 Hypo-osmolality and hyponatremia: Secondary | ICD-10-CM | POA: Diagnosis not present

## 2024-05-15 DIAGNOSIS — M818 Other osteoporosis without current pathological fracture: Secondary | ICD-10-CM | POA: Diagnosis not present

## 2024-05-15 DIAGNOSIS — F0390 Unspecified dementia without behavioral disturbance: Secondary | ICD-10-CM | POA: Diagnosis not present

## 2024-05-15 DIAGNOSIS — I10 Essential (primary) hypertension: Secondary | ICD-10-CM | POA: Diagnosis not present

## 2024-06-10 DIAGNOSIS — L821 Other seborrheic keratosis: Secondary | ICD-10-CM | POA: Diagnosis not present

## 2024-06-10 DIAGNOSIS — D0472 Carcinoma in situ of skin of left lower limb, including hip: Secondary | ICD-10-CM | POA: Diagnosis not present

## 2024-06-10 DIAGNOSIS — L720 Epidermal cyst: Secondary | ICD-10-CM | POA: Diagnosis not present

## 2024-06-10 DIAGNOSIS — D225 Melanocytic nevi of trunk: Secondary | ICD-10-CM | POA: Diagnosis not present

## 2024-06-10 DIAGNOSIS — D0471 Carcinoma in situ of skin of right lower limb, including hip: Secondary | ICD-10-CM | POA: Diagnosis not present

## 2024-07-01 DIAGNOSIS — F0390 Unspecified dementia without behavioral disturbance: Secondary | ICD-10-CM | POA: Diagnosis not present

## 2024-07-01 DIAGNOSIS — R32 Unspecified urinary incontinence: Secondary | ICD-10-CM | POA: Diagnosis not present

## 2024-07-01 DIAGNOSIS — R55 Syncope and collapse: Secondary | ICD-10-CM | POA: Diagnosis not present

## 2024-07-01 DIAGNOSIS — R413 Other amnesia: Secondary | ICD-10-CM | POA: Diagnosis not present

## 2024-07-08 NOTE — Progress Notes (Unsigned)
 Assessment/Plan:     Sara Humphrey is a very pleasant 89 y.o. year old RH female with a history of hypertension, hyperlipidemia, seen today for evaluation of memory loss. MoCA today is .  Etiology is unclear, workup is in progress.  Patient is able to participate on ADLs*** Discussed starting donepezil 5 mg daily with goal of 10 mg daily if tolerated, patient agrees to proceed.   Memory Impairment of unclear etiology, concern for ***  MRI brain without contrast to assess for underlying structural abnormality and assess vascular load  Neurocognitive testing to further evaluate cognitive concerns and determine other underlying cause of memory changes, including potential contribution from sleep, anxiety, attention, or depression among others  Check B12, TSH Recommend good control of cardiovascular risk factors.   Continue to control mood as per PCP Folllow up in ***months   Subjective:    The patient is accompanied by ***  who supplements  the history.    How long did patient have memory difficulties?  For about.  Patient reports some difficulty remembering new information, recent conversations, names. repeats oneself?  Endorsed Disoriented when walking into a room? Denies ***  Leaving objects in unusual places?  Denies.   Wandering behavior? Denies.   Any personality changes, or depression, anxiety? Denies *** Hallucinations or paranoia? Denies.   Seizures? Denies.    Any sleep changes?  Sleeps well *** Does not sleep well. **  frequent nightmares or dream reenactment, other REM behavior or sleepwalking   Sleep apnea? Denies.   Any hygiene concerns?  Denies.   Independent of bathing and dressing? Endorsed  Does the patient need help with medications?  is in charge *** Who is in charge of the finances?  is in charge   *** Any changes in appetite?   Denies. ***   Patient have trouble swallowing?  Denies.   Does the patient cook? No*** yes, denies forgetting common recipes or  kitchen accidents *** Any headaches?  Denies.   Chronic pain? Denies.   Ambulates with difficulty? Denies. ***  Needs a cane*** Needs a walker *** to ambulate for stability.   Recent falls or head injuries? Denies.     Vision changes?  Denies any new issues.  Has a history of*** Any strokelike symptoms? Denies.   Any tremors? Denies. *** Any anosmia? Denies.   Any incontinence of urine? Denies.   Any bowel dysfunction? Denies.      Patient lives with ***  History of heavy alcohol intake? Denies.   History of heavy tobacco use? Denies.   Family history of dementia?   *** with dementia  Does patient drive? No longer drives  *** yes, denies getting lost.***   Pertinent labs: ***  MRI brain personally reviewed: ***  No Known Allergies  Current Outpatient Medications  Medication Instructions   amoxicillin -clavulanate (AUGMENTIN ) 875-125 MG tablet 1 tablet, Oral, 2 times daily   aspirin  EC 81 mg, Oral, Daily   B Complex Vitamins (B-COMPLEX/B-12) TABS    cholecalciferol  (VITAMIN D ) 1,000 Units, Daily   docusate sodium  (COLACE) 100 mg, Oral, 2 times daily PRN   donepezil (ARICEPT) 10 mg, Daily at bedtime   LUMIGAN 0.01 % SOLN PUT 1 DROP IN BOTH EYES AT BEDTIME   Rosuvastatin Calcium 10 MG CPSP    timolol  (TIMOPTIC ) 0.5 % ophthalmic solution 1 drop, Daily   vitamin C 100 mg, Oral, Daily   VITAMIN E PO 1 tablet, Daily     VITALS:  There  were no vitals filed for this visit.   Physical Exam  :     No data to display              No data to display             HEENT:  Normocephalic, atraumatic.  The superficial temporal arteries are without ropiness or tenderness. Cardiovascular: Regular rate and rhythm. Lungs: Clear to auscultation bilaterally. Neck: There are no carotid bruits noted bilaterally. Orientation:  Alert and oriented to person, place and not to time***. No aphasia or dysarthria. Fund of knowledge is appropriate. Recent and remote memory impaired.   Attention and concentration are reduced***.  Able to name objects and repeat phrases. *** Delayed recall  /5 .*** Cranial nerves: There is good facial symmetry. Extraocular muscles are intact and visual fields are full to confrontational testing. Speech is fluent and clear. No tongue deviation. Hearing is intact to conversational tone.*** Tone: Tone is good throughout. Sensation: Sensation is intact to light touch.  Vibration is intact at the bilateral big toe.  Coordination: The patient has no difficulty with RAM's or FNF bilaterally. Normal finger to nose  Motor: Strength is 5/5 in the bilateral upper and lower extremities. There is no pronator drift. There are no fasciculations noted. DTR's: Deep tendon reflexes are 2/4 bilaterally. Gait and Station: The patient is able to ambulate without difficulty. Gait is cautious and narrow. Stride length is normal. ***      Thank you for allowing us  the opportunity to participate in the care of this nice patient. Please do not hesitate to contact us  for any questions or concerns.   Total time spent on today's visit was *** minutes dedicated to this patient today, preparing to see patient, examining the patient, ordering tests and/or medications and counseling the patient, documenting clinical information in the EHR or other health record, independently interpreting results and communicating results to the patient/family, discussing treatment and goals, answering patient's questions and coordinating care.  Cc:  Elliot Charm, MD  Camie Sevin 07/08/2024 7:33 PM

## 2024-07-09 ENCOUNTER — Encounter: Payer: Self-pay | Admitting: Physician Assistant

## 2024-07-09 ENCOUNTER — Ambulatory Visit: Admitting: Physician Assistant

## 2024-07-09 ENCOUNTER — Encounter

## 2024-07-09 ENCOUNTER — Telehealth: Payer: Self-pay | Admitting: Physician Assistant

## 2024-07-09 VITALS — BP 140/82 | HR 71 | Resp 20 | Ht 67.0 in | Wt 127.0 lb

## 2024-07-09 DIAGNOSIS — R55 Syncope and collapse: Secondary | ICD-10-CM

## 2024-07-09 DIAGNOSIS — R413 Other amnesia: Secondary | ICD-10-CM | POA: Diagnosis not present

## 2024-07-09 NOTE — Patient Instructions (Addendum)
 It was a pleasure to see you today at our office.   Recommendations:   MRI of the brain, the radiology office will call you to arrange you appointment  [phone] EEG Continue donepezil  Referral to cardiology  Follow up in 3  months Recommend visiting the website :  Dementia Success Path to better understand some behaviors related to memory loss.     [email or web address]/neuro-[email or web address]-apps-[email or web address]-[email or web address]   RECOMMENDATIONS FOR ALL PATIENTS WITH MEMORY PROBLEMS: 1. Continue to exercise (Recommend 30 minutes of walking everyday, or 3 hours every week) 2. Increase social interactions - continue going to [place] and enjoy social gatherings with friends and family 3. Eat healthy, avoid fried foods and eat more fruits and vegetables 4. Maintain adequate blood pressure, blood sugar, and blood cholesterol level. Reducing the risk of stroke and cardiovascular disease also helps promoting better memory. 5. Avoid stressful situations. Live a simple life and avoid aggravations. Organize your time and prepare for the next day in anticipation. 6. Sleep well, avoid any interruptions of sleep and avoid any distractions in the bedroom that may interfere with adequate sleep quality 7. Avoid sugar, avoid sweets as there is a strong link between excessive sugar intake, diabetes, and cognitive impairment We discussed the Mediterranean diet, which has been shown to help patients reduce the risk of progressive memory disorders and reduces cardiovascular risk. This includes eating fish, eat fruits and green leafy vegetables, nuts like almonds and hazelnuts, walnuts, and also use olive oil. Avoid fast foods and fried foods as much as possible. Avoid sweets and sugar as sugar use has been linked to worsening of memory function.  There is always a concern of gradual progression of memory problems. If this is the case, then we may need to adjust level of care according to patient needs. Support,  both to the patient and caregiver, should then be put into place.      You have been referred for a neuropsychological evaluation (i.e., evaluation of memory and thinking abilities). Please bring someone with you to this appointment if possible, as it is helpful for the doctor to hear from both you and another adult who knows you well. Please bring eyeglasses and hearing aids if you wear them.    The evaluation will take approximately 3 hours and has two parts:   The first part is a clinical interview with the neuropsychologist (Dr. Richie or Dr. Gayland). During the interview, the neuropsychologist will speak with you and the individual you brought to the appointment.    The second part of the evaluation is testing with the doctor's technician Neal or Luke). During the testing, the technician will ask you to remember different types of material, solve problems, and answer some questionnaires. Your family member will not be present for this portion of the evaluation.   Please note: We must reserve several hours of the neuropsychologist's time and the psychometrician's time for your evaluation appointment. As such, there is a No-Show fee of $100. If you are unable to attend any of your appointments, please contact our office as soon as possible to reschedule.      DRIVING: Regarding driving, in patients with progressive memory problems, driving will be impaired. We advise to have someone else do the driving if trouble finding directions or if minor accidents are reported. Independent driving assessment is available to determine safety of driving.   If you are interested in the driving assessment, you can contact the following:  [employer] in [place] [phone]  [facility] [phone]  [facility] [phone]  [facility] [phone] or [phone]   FALL PRECAUTIONS: Be cautious when walking. Scan the area for obstacles that may  increase the risk of trips and falls. When getting up in the mornings, sit up at the edge of the bed for a few minutes before getting out of bed. Consider elevating the bed at the head end to avoid drop of blood pressure when getting up. Walk always in a well-lit room (use night lights in the walls). Avoid area rugs or power cords from appliances in the middle of the walkways. Use a walker or a cane if necessary and consider physical therapy for balance exercise. Get your eyesight checked regularly.  FINANCIAL OVERSIGHT: Supervision, especially oversight when making financial decisions or transactions is also recommended.  HOME SAFETY: Consider the safety of the kitchen when operating appliances like stoves, microwave oven, and blender. Consider having supervision and share cooking responsibilities until no longer able to participate in those. Accidents with firearms and other hazards in the house should be identified and addressed as well.   ABILITY TO BE LEFT ALONE: If patient is unable to contact 911 operator, consider using LifeLine, or when the need is there, arrange for someone to stay with patients. Smoking is a fire hazard, consider supervision or cessation. Risk of wandering should be assessed by caregiver and if detected at any point, supervision and safe proof recommendations should be instituted.  MEDICATION SUPERVISION: Inability to self-administer medication needs to be constantly addressed. Implement a mechanism to ensure safe administration of the medications.      Mediterranean Diet A Mediterranean diet refers to food and lifestyle choices that are based on the traditions of countries located on the [place]. This way of eating has been shown to help prevent certain conditions and improve outcomes for people who have chronic diseases, like kidney disease and heart disease. What are tips for following this plan? Lifestyle  Cook and eat meals together with your family, when  possible. Drink enough fluid to keep your urine clear or pale yellow. Be physically active every day. This includes: Aerobic exercise like running or swimming. Leisure activities like gardening, walking, or housework. Get 7-8 hours of sleep each night. If recommended by your health care provider, drink red wine in moderation. This means 1 glass a day for nonpregnant women and 2 glasses a day for men. A glass of wine equals 5 oz (150 mL). Reading food labels  Check the serving size of packaged foods. For foods such as rice and pasta, the serving size refers to the amount of cooked product, not dry. Check the total fat in packaged foods. Avoid foods that have saturated fat or trans fats. Check the ingredients list for added sugars, such as corn syrup. Shopping  At the grocery store, buy most of your food from the areas near the walls of the store. This includes: Fresh fruits and vegetables (produce). Grains, beans, nuts, and seeds. Some of these may be available in unpackaged forms or large amounts (in bulk). Fresh seafood. Poultry and eggs. Low-fat dairy products. Buy whole ingredients instead of prepackaged foods. Buy fresh fruits and vegetables in-season from local farmers markets. Buy frozen fruits and vegetables in resealable bags. If you do not have access to quality fresh seafood, buy precooked frozen shrimp or canned fish, such as tuna, salmon, or sardines. Buy small amounts of raw or cooked vegetables, salads, or olives from the deli or salad bar at  your store. Stock your pantry so you always have certain foods on hand, such as olive oil, canned tuna, canned tomatoes, rice, pasta, and beans. Cooking  Cook foods with extra-virgin olive oil instead of using butter or other vegetable oils. Have meat as a side dish, and have vegetables or grains as your main dish. This means having meat in small portions or adding small amounts of meat to foods like pasta or stew. Use beans or  vegetables instead of meat in common dishes like chili or lasagna. Experiment with different cooking methods. Try roasting or broiling vegetables instead of steaming or sauteing them. Add frozen vegetables to soups, stews, pasta, or rice. Add nuts or seeds for added healthy fat at each meal. You can add these to yogurt, salads, or vegetable dishes. Marinate fish or vegetables using olive oil, lemon juice, garlic, and fresh herbs. Meal planning  Plan to eat 1 vegetarian meal one day each week. Try to work up to 2 vegetarian meals, if possible. Eat seafood 2 or more times a week. Have healthy snacks readily available, such as: Vegetable sticks with hummus. Greek yogurt. Fruit and nut trail mix. Eat balanced meals throughout the week. This includes: Fruit: 2-3 servings a day Vegetables: 4-5 servings a day Low-fat dairy: 2 servings a day Fish, poultry, or lean meat: 1 serving a day Beans and legumes: 2 or more servings a week Nuts and seeds: 1-2 servings a day Whole grains: 6-8 servings a day Extra-virgin olive oil: 3-4 servings a day Limit red meat and sweets to only a few servings a month What are my food choices? Mediterranean diet Recommended Grains: Whole-grain pasta. Brown rice. Bulgar wheat. Polenta. Couscous. Whole-wheat bread. [place] Made[place]. Vegetables: Artichokes. Beets. Broccoli. Cabbage. Carrots. Eggplant. Green beans. Chard. Kale. Spinach. Onions. Leeks. Peas. Squash. Tomatoes. Peppers. Radishes. Fruits: Apples. Apricots. Avocado. Berries. Bananas. Cherries. Dates. Figs. Grapes. Lemons. Melon. Oranges. Peaches. Plums. Pomegranate. Meats and other protein foods: Beans. Almonds. Sunflower seeds. Pine nuts. Peanuts. Cod. Salmon. Scallops. Shrimp. Tuna. Tilapia. Clams. Oysters. Eggs. Dairy: Low-fat milk. Cheese. Greek yogurt. Beverages: Water. Red wine. Herbal tea. Fats and oils: Extra virgin olive oil. Avocado oil. Grape seed oil. Sweets and desserts: [place] yogurt with honey.  Baked apples. Poached pears. Trail mix. Seasoning and other foods: Basil. Cilantro. Coriander. Cumin. Mint. Parsley. Sage. Rosemary. Tarragon. Garlic. Oregano. Thyme. Pepper. Balsalmic vinegar. Tahini. Hummus. Tomato sauce. Olives. Mushrooms. Limit these Grains: Prepackaged pasta or rice dishes. Prepackaged cereal with added sugar. Vegetables: Deep fried potatoes (french fries). Fruits: Fruit canned in syrup. Meats and other protein foods: Beef. Pork. Lamb. Poultry with skin. Hot dogs. [place]. Dairy: Ice cream. Sour cream. Whole milk. Beverages: Juice. Sugar-sweetened soft drinks. Beer. Liquor and spirits. Fats and oils: Butter. Canola oil. Vegetable oil. Beef fat (tallow). Lard. Sweets and desserts: Cookies. Cakes. Pies. Candy. Seasoning and other foods: Mayonnaise. Premade sauces and marinades. The items listed may not be a complete list. Talk with your dietitian about what dietary choices are right for you. Summary The Mediterranean diet includes both food and lifestyle choices. Eat a variety of fresh fruits and vegetables, beans, nuts, seeds, and whole grains. Limit the amount of red meat and sweets that you eat. Talk with your health care provider about whether it is safe for you to drink red wine in moderation. This means 1 glass a day for nonpregnant women and 2 glasses a day for men. A glass of wine equals 5 oz (150 mL). This information is not intended to  replace advice given to you by your health care provider. Make sure you discuss any questions you have with your health care provider. Document Released: 04/28/2016 Document Revised: 05/31/2016 Document Reviewed: 04/28/2016 Elsevier Interactive Patient Education  2017 [employer].

## 2024-07-09 NOTE — Telephone Encounter (Signed)
 Patient is going to [employer]. Thanked me for calling.

## 2024-07-09 NOTE — Telephone Encounter (Signed)
 Pt son called and LM with AN. He wants to know if the insurance is going to approve the MRI and EEG

## 2024-07-15 ENCOUNTER — Other Ambulatory Visit

## 2024-07-25 ENCOUNTER — Encounter: Payer: Self-pay | Admitting: Podiatry

## 2024-07-25 ENCOUNTER — Ambulatory Visit: Admitting: Podiatry

## 2024-07-25 DIAGNOSIS — M79674 Pain in right toe(s): Secondary | ICD-10-CM | POA: Diagnosis not present

## 2024-07-25 DIAGNOSIS — B351 Tinea unguium: Secondary | ICD-10-CM | POA: Diagnosis not present

## 2024-07-25 DIAGNOSIS — M79675 Pain in left toe(s): Secondary | ICD-10-CM

## 2024-07-25 NOTE — Progress Notes (Signed)
  Subjective:  Patient ID: Sara Humphrey, female    DOB: 1931-07-29,   MRN: [id number]  Chief Complaint  Patient presents with   Nail Problem    Trim her toenails.    89 y.o. female presents for concern of right great toenail changes. Relates this has been ongoing for years. Relates difficulty with trimming nails Relates pain in the nails and cannot take care of them herself.   . Denies any other pedal complaints. Denies n/v/f/c.   Past Medical History:  Diagnosis Date   Glaucoma     Objective:  Physical Exam: Vascular: DP/PT pulses 2/4 bilateral. CFT <3 seconds. Normal hair growth on digits. No edema.  Skin. No lacerations or abrasions bilateral feet. Nails 1-5 bilateral are thickened dystrophic and elongated.  Musculoskeletal: MMT 5/5 bilateral lower extremities in DF, PF, Inversion and Eversion. Deceased ROM in DF of ankle joint.  Neurological: Sensation intact to light touch.   Assessment:   1. Pain due to onychomycosis of toenails of both feet      Plan:  Patient was evaluated and treated and all questions answered. -ABN signed -Mechanically debrided all nails 1-5 bilateral using sterile nail nipper and filed with dremel without incident as courtesy today.  -Answered all patient questions -Patient to return  in 3 months for at risk foot care -Patient advised to call the office if any problems or questions arise in the meantime.   Asberry Failing, DPM

## 2024-08-05 DIAGNOSIS — D0472 Carcinoma in situ of skin of left lower limb, including hip: Secondary | ICD-10-CM | POA: Diagnosis not present

## 2024-08-12 ENCOUNTER — Telehealth: Payer: Self-pay

## 2024-08-12 NOTE — Telephone Encounter (Signed)
 [facility] has been trying to get patient for scheduling. Multiple attemtps to reach patient. Mri wo contrast.

## 2024-08-13 ENCOUNTER — Other Ambulatory Visit (HOSPITAL_BASED_OUTPATIENT_CLINIC_OR_DEPARTMENT_OTHER): Payer: Self-pay | Admitting: Internal Medicine

## 2024-08-13 DIAGNOSIS — Z0000 Encounter for general adult medical examination without abnormal findings: Secondary | ICD-10-CM | POA: Diagnosis not present

## 2024-08-13 DIAGNOSIS — Z1331 Encounter for screening for depression: Secondary | ICD-10-CM | POA: Diagnosis not present

## 2024-08-13 DIAGNOSIS — Z23 Encounter for immunization: Secondary | ICD-10-CM | POA: Diagnosis not present

## 2024-08-13 DIAGNOSIS — Z7189 Other specified counseling: Secondary | ICD-10-CM | POA: Diagnosis not present

## 2024-08-13 DIAGNOSIS — Z13820 Encounter for screening for osteoporosis: Secondary | ICD-10-CM

## 2024-08-13 DIAGNOSIS — I700 Atherosclerosis of aorta: Secondary | ICD-10-CM | POA: Diagnosis not present

## 2024-08-13 DIAGNOSIS — F0390 Unspecified dementia without behavioral disturbance: Secondary | ICD-10-CM | POA: Diagnosis not present

## 2024-08-13 DIAGNOSIS — M810 Age-related osteoporosis without current pathological fracture: Secondary | ICD-10-CM | POA: Diagnosis not present

## 2024-08-13 DIAGNOSIS — E785 Hyperlipidemia, unspecified: Secondary | ICD-10-CM | POA: Diagnosis not present

## 2024-08-13 DIAGNOSIS — N1831 Chronic kidney disease, stage 3a: Secondary | ICD-10-CM | POA: Diagnosis not present

## 2024-08-13 DIAGNOSIS — Z8711 Personal history of peptic ulcer disease: Secondary | ICD-10-CM | POA: Diagnosis not present

## 2024-08-13 DIAGNOSIS — D696 Thrombocytopenia, unspecified: Secondary | ICD-10-CM | POA: Diagnosis not present

## 2024-10-24 ENCOUNTER — Ambulatory Visit: Payer: Self-pay | Admitting: Physician Assistant

## 2024-10-24 ENCOUNTER — Ambulatory Visit: Admitting: Podiatry

## 2024-10-24 ENCOUNTER — Encounter: Payer: Self-pay | Admitting: Podiatry

## 2024-10-24 DIAGNOSIS — B351 Tinea unguium: Secondary | ICD-10-CM

## 2024-10-24 NOTE — Progress Notes (Signed)
"  °  Subjective:  Patient ID: Sara Humphrey, female    DOB: Mar 03, 1931,   MRN: [id number]  Chief Complaint  Patient presents with   Nail Problem    She's going to trim these nails.    89 y.o. female presents for concern of right great toenail changes. Relates this has been ongoing for years. Relates difficulty with trimming nails Relates pain in the nails and cannot take care of them herself.   . Denies any other pedal complaints. Denies n/v/f/c.   Past Medical History:  Diagnosis Date   Glaucoma     Objective:  Physical Exam: Vascular: DP/PT pulses 2/4 bilateral. CFT <3 seconds. Normal hair growth on digits. No edema.  Skin. No lacerations or abrasions bilateral feet. Nails 1-5 bilateral are thickened dystrophic and elongated.  Musculoskeletal: MMT 5/5 bilateral lower extremities in DF, PF, Inversion and Eversion. Deceased ROM in DF of ankle joint.  Neurological: Sensation intact to light touch.   Assessment:   1. Pain due to onychomycosis of toenails of both feet      Plan:  Patient was evaluated and treated and all questions answered. -ABN signed 01/11/24 -Mechanically debrided all nails 1-5 bilateral using sterile nail nipper and filed with dremel without incident as courtesy today.  -Answered all patient questions -Patient to return  in 3 months for at risk foot care -Patient advised to call the office if any problems or questions arise in the meantime.   Asberry Failing, DPM    "

## 2025-02-17 ENCOUNTER — Ambulatory Visit: Admitting: Podiatry
